# Patient Record
Sex: Female | Born: 1970 | Race: White | Hispanic: No | Smoking: Former smoker
Health system: Southern US, Community
[De-identification: ages and names within clinical notes are randomized; demographics above are authoritative.]

## PROBLEM LIST (undated history)

## (undated) DIAGNOSIS — G43009 Migraine without aura, not intractable, without status migrainosus: Secondary | ICD-10-CM

## (undated) DIAGNOSIS — F419 Anxiety disorder, unspecified: Secondary | ICD-10-CM

## (undated) DIAGNOSIS — T7840XA Allergy, unspecified, initial encounter: Secondary | ICD-10-CM

## (undated) DIAGNOSIS — F909 Attention-deficit hyperactivity disorder, unspecified type: Secondary | ICD-10-CM

## (undated) DIAGNOSIS — F331 Major depressive disorder, recurrent, moderate: Secondary | ICD-10-CM

## (undated) DIAGNOSIS — G709 Myoneural disorder, unspecified: Secondary | ICD-10-CM

## (undated) DIAGNOSIS — Z5189 Encounter for other specified aftercare: Secondary | ICD-10-CM

## (undated) HISTORY — PX: VAGINAL HYSTERECTOMY: SUR661

## (undated) HISTORY — PX: TONSILLECTOMY: SUR1361

## (undated) HISTORY — DX: Migraine without aura, not intractable, without status migrainosus: G43.009

## (undated) HISTORY — PX: TUBAL LIGATION: SHX77

## (undated) HISTORY — DX: Major depressive disorder, recurrent, moderate: F33.1

## (undated) HISTORY — PX: KNEE ARTHROSCOPY: SUR90

## (undated) HISTORY — DX: Attention-deficit hyperactivity disorder, unspecified type: F90.9

## (undated) HISTORY — PX: OTHER SURGICAL HISTORY: SHX169

## (undated) HISTORY — PX: DILATION AND CURETTAGE OF UTERUS: SHX78

## (undated) HISTORY — DX: Encounter for other specified aftercare: Z51.89

## (undated) HISTORY — DX: Allergy, unspecified, initial encounter: T78.40XA

## (undated) HISTORY — DX: Myoneural disorder, unspecified: G70.9

---

## 2012-05-01 ENCOUNTER — Telehealth: Payer: Self-pay | Admitting: *Deleted

## 2012-05-01 NOTE — Telephone Encounter (Signed)
Patient returned my call and I confirmed 05/24/12 genetics appt w/ pt.  Took paperwork to Clydie Braun.

## 2012-05-24 ENCOUNTER — Ambulatory Visit: Payer: Self-pay | Admitting: Genetic Counselor

## 2012-05-24 ENCOUNTER — Other Ambulatory Visit: Payer: Self-pay | Admitting: Lab

## 2012-05-24 NOTE — Progress Notes (Signed)
Patient did not show for her appointment.

## 2012-05-25 ENCOUNTER — Telehealth: Payer: Self-pay | Admitting: *Deleted

## 2012-05-25 NOTE — Telephone Encounter (Signed)
Left message for pt to return my call so I can reschedule genetic appt.

## 2012-06-11 ENCOUNTER — Telehealth: Payer: Self-pay | Admitting: *Deleted

## 2012-06-11 NOTE — Telephone Encounter (Signed)
Left message for pt to return my call so I can schedule a genetic appt w/ her.  Took paperwork to Clydie Braun to see if she wanted to send a letter to pt since I have been unsuccessful at reaching her.

## 2014-08-06 ENCOUNTER — Encounter: Payer: Self-pay | Admitting: Family Medicine

## 2014-08-06 LAB — HM COLONOSCOPY

## 2016-04-20 DIAGNOSIS — M545 Low back pain: Secondary | ICD-10-CM | POA: Diagnosis not present

## 2016-07-07 DIAGNOSIS — J018 Other acute sinusitis: Secondary | ICD-10-CM | POA: Diagnosis not present

## 2016-07-18 DIAGNOSIS — M79645 Pain in left finger(s): Secondary | ICD-10-CM | POA: Diagnosis not present

## 2016-10-05 DIAGNOSIS — R0789 Other chest pain: Secondary | ICD-10-CM | POA: Diagnosis not present

## 2016-10-05 DIAGNOSIS — R079 Chest pain, unspecified: Secondary | ICD-10-CM | POA: Diagnosis not present

## 2016-12-16 DIAGNOSIS — F331 Major depressive disorder, recurrent, moderate: Secondary | ICD-10-CM | POA: Diagnosis not present

## 2016-12-16 DIAGNOSIS — J018 Other acute sinusitis: Secondary | ICD-10-CM | POA: Diagnosis not present

## 2017-05-26 DIAGNOSIS — J018 Other acute sinusitis: Secondary | ICD-10-CM | POA: Diagnosis not present

## 2017-05-26 DIAGNOSIS — F5101 Primary insomnia: Secondary | ICD-10-CM | POA: Diagnosis not present

## 2017-05-26 DIAGNOSIS — F331 Major depressive disorder, recurrent, moderate: Secondary | ICD-10-CM | POA: Diagnosis not present

## 2017-06-26 DIAGNOSIS — Z6829 Body mass index (BMI) 29.0-29.9, adult: Secondary | ICD-10-CM | POA: Diagnosis not present

## 2017-06-26 DIAGNOSIS — Z0000 Encounter for general adult medical examination without abnormal findings: Secondary | ICD-10-CM | POA: Diagnosis not present

## 2017-06-30 DIAGNOSIS — Z0000 Encounter for general adult medical examination without abnormal findings: Secondary | ICD-10-CM | POA: Diagnosis not present

## 2017-07-21 DIAGNOSIS — Z1231 Encounter for screening mammogram for malignant neoplasm of breast: Secondary | ICD-10-CM | POA: Diagnosis not present

## 2017-08-25 DIAGNOSIS — J018 Other acute sinusitis: Secondary | ICD-10-CM | POA: Diagnosis not present

## 2017-10-02 DIAGNOSIS — N760 Acute vaginitis: Secondary | ICD-10-CM | POA: Diagnosis not present

## 2017-10-02 DIAGNOSIS — F331 Major depressive disorder, recurrent, moderate: Secondary | ICD-10-CM | POA: Diagnosis not present

## 2017-10-18 DIAGNOSIS — J018 Other acute sinusitis: Secondary | ICD-10-CM | POA: Diagnosis not present

## 2017-11-10 DIAGNOSIS — J329 Chronic sinusitis, unspecified: Secondary | ICD-10-CM | POA: Diagnosis not present

## 2017-12-19 DIAGNOSIS — R0981 Nasal congestion: Secondary | ICD-10-CM | POA: Diagnosis not present

## 2017-12-19 DIAGNOSIS — J343 Hypertrophy of nasal turbinates: Secondary | ICD-10-CM | POA: Diagnosis not present

## 2017-12-19 DIAGNOSIS — J3489 Other specified disorders of nose and nasal sinuses: Secondary | ICD-10-CM | POA: Diagnosis not present

## 2017-12-19 DIAGNOSIS — J342 Deviated nasal septum: Secondary | ICD-10-CM | POA: Diagnosis not present

## 2018-02-27 DIAGNOSIS — J3489 Other specified disorders of nose and nasal sinuses: Secondary | ICD-10-CM | POA: Diagnosis not present

## 2018-02-27 DIAGNOSIS — J342 Deviated nasal septum: Secondary | ICD-10-CM | POA: Diagnosis not present

## 2018-02-27 DIAGNOSIS — R0981 Nasal congestion: Secondary | ICD-10-CM | POA: Diagnosis not present

## 2018-02-27 DIAGNOSIS — J343 Hypertrophy of nasal turbinates: Secondary | ICD-10-CM | POA: Diagnosis not present

## 2018-03-14 DIAGNOSIS — R0981 Nasal congestion: Secondary | ICD-10-CM | POA: Diagnosis not present

## 2018-03-14 DIAGNOSIS — J0101 Acute recurrent maxillary sinusitis: Secondary | ICD-10-CM | POA: Diagnosis not present

## 2018-03-14 DIAGNOSIS — J322 Chronic ethmoidal sinusitis: Secondary | ICD-10-CM | POA: Diagnosis not present

## 2018-03-14 DIAGNOSIS — J320 Chronic maxillary sinusitis: Secondary | ICD-10-CM | POA: Diagnosis not present

## 2018-03-14 DIAGNOSIS — J343 Hypertrophy of nasal turbinates: Secondary | ICD-10-CM | POA: Diagnosis not present

## 2018-03-14 DIAGNOSIS — J342 Deviated nasal septum: Secondary | ICD-10-CM | POA: Diagnosis not present

## 2018-03-14 DIAGNOSIS — J019 Acute sinusitis, unspecified: Secondary | ICD-10-CM | POA: Diagnosis not present

## 2018-03-14 DIAGNOSIS — J0121 Acute recurrent ethmoidal sinusitis: Secondary | ICD-10-CM | POA: Diagnosis not present

## 2018-03-14 DIAGNOSIS — J3489 Other specified disorders of nose and nasal sinuses: Secondary | ICD-10-CM | POA: Diagnosis not present

## 2018-03-14 DIAGNOSIS — J329 Chronic sinusitis, unspecified: Secondary | ICD-10-CM | POA: Diagnosis not present

## 2018-03-23 DIAGNOSIS — J329 Chronic sinusitis, unspecified: Secondary | ICD-10-CM | POA: Diagnosis not present

## 2018-03-29 DIAGNOSIS — J329 Chronic sinusitis, unspecified: Secondary | ICD-10-CM | POA: Diagnosis not present

## 2018-04-12 DIAGNOSIS — J329 Chronic sinusitis, unspecified: Secondary | ICD-10-CM | POA: Diagnosis not present

## 2018-09-07 DIAGNOSIS — G43009 Migraine without aura, not intractable, without status migrainosus: Secondary | ICD-10-CM | POA: Diagnosis not present

## 2018-09-07 DIAGNOSIS — F900 Attention-deficit hyperactivity disorder, predominantly inattentive type: Secondary | ICD-10-CM | POA: Diagnosis not present

## 2018-10-11 DIAGNOSIS — J018 Other acute sinusitis: Secondary | ICD-10-CM | POA: Diagnosis not present

## 2018-10-11 DIAGNOSIS — G43009 Migraine without aura, not intractable, without status migrainosus: Secondary | ICD-10-CM | POA: Diagnosis not present

## 2019-04-19 DIAGNOSIS — F900 Attention-deficit hyperactivity disorder, predominantly inattentive type: Secondary | ICD-10-CM | POA: Diagnosis not present

## 2019-04-19 DIAGNOSIS — F331 Major depressive disorder, recurrent, moderate: Secondary | ICD-10-CM | POA: Diagnosis not present

## 2019-04-19 DIAGNOSIS — G43009 Migraine without aura, not intractable, without status migrainosus: Secondary | ICD-10-CM | POA: Diagnosis not present

## 2019-06-28 DIAGNOSIS — S8992XA Unspecified injury of left lower leg, initial encounter: Secondary | ICD-10-CM | POA: Diagnosis not present

## 2019-06-28 DIAGNOSIS — S93602A Unspecified sprain of left foot, initial encounter: Secondary | ICD-10-CM | POA: Diagnosis not present

## 2019-07-22 DIAGNOSIS — M25572 Pain in left ankle and joints of left foot: Secondary | ICD-10-CM | POA: Diagnosis not present

## 2019-09-02 DIAGNOSIS — F900 Attention-deficit hyperactivity disorder, predominantly inattentive type: Secondary | ICD-10-CM | POA: Diagnosis not present

## 2019-09-02 DIAGNOSIS — J018 Other acute sinusitis: Secondary | ICD-10-CM | POA: Diagnosis not present

## 2019-12-09 DIAGNOSIS — D225 Melanocytic nevi of trunk: Secondary | ICD-10-CM | POA: Diagnosis not present

## 2019-12-20 DIAGNOSIS — L821 Other seborrheic keratosis: Secondary | ICD-10-CM | POA: Diagnosis not present

## 2020-04-15 DIAGNOSIS — M9905 Segmental and somatic dysfunction of pelvic region: Secondary | ICD-10-CM | POA: Diagnosis not present

## 2020-04-15 DIAGNOSIS — M9904 Segmental and somatic dysfunction of sacral region: Secondary | ICD-10-CM | POA: Diagnosis not present

## 2020-04-15 DIAGNOSIS — M9903 Segmental and somatic dysfunction of lumbar region: Secondary | ICD-10-CM | POA: Diagnosis not present

## 2020-04-15 DIAGNOSIS — M461 Sacroiliitis, not elsewhere classified: Secondary | ICD-10-CM | POA: Diagnosis not present

## 2020-04-20 DIAGNOSIS — M9904 Segmental and somatic dysfunction of sacral region: Secondary | ICD-10-CM | POA: Diagnosis not present

## 2020-04-20 DIAGNOSIS — M9905 Segmental and somatic dysfunction of pelvic region: Secondary | ICD-10-CM | POA: Diagnosis not present

## 2020-04-20 DIAGNOSIS — M461 Sacroiliitis, not elsewhere classified: Secondary | ICD-10-CM | POA: Diagnosis not present

## 2020-04-20 DIAGNOSIS — M9903 Segmental and somatic dysfunction of lumbar region: Secondary | ICD-10-CM | POA: Diagnosis not present

## 2020-04-22 DIAGNOSIS — M461 Sacroiliitis, not elsewhere classified: Secondary | ICD-10-CM | POA: Diagnosis not present

## 2020-04-22 DIAGNOSIS — M9904 Segmental and somatic dysfunction of sacral region: Secondary | ICD-10-CM | POA: Diagnosis not present

## 2020-04-22 DIAGNOSIS — M9903 Segmental and somatic dysfunction of lumbar region: Secondary | ICD-10-CM | POA: Diagnosis not present

## 2020-04-22 DIAGNOSIS — M9905 Segmental and somatic dysfunction of pelvic region: Secondary | ICD-10-CM | POA: Diagnosis not present

## 2020-04-24 DIAGNOSIS — M461 Sacroiliitis, not elsewhere classified: Secondary | ICD-10-CM | POA: Diagnosis not present

## 2020-04-24 DIAGNOSIS — M9904 Segmental and somatic dysfunction of sacral region: Secondary | ICD-10-CM | POA: Diagnosis not present

## 2020-04-24 DIAGNOSIS — M9905 Segmental and somatic dysfunction of pelvic region: Secondary | ICD-10-CM | POA: Diagnosis not present

## 2020-04-24 DIAGNOSIS — M9903 Segmental and somatic dysfunction of lumbar region: Secondary | ICD-10-CM | POA: Diagnosis not present

## 2020-04-27 DIAGNOSIS — M9903 Segmental and somatic dysfunction of lumbar region: Secondary | ICD-10-CM | POA: Diagnosis not present

## 2020-04-27 DIAGNOSIS — M9905 Segmental and somatic dysfunction of pelvic region: Secondary | ICD-10-CM | POA: Diagnosis not present

## 2020-04-27 DIAGNOSIS — M9904 Segmental and somatic dysfunction of sacral region: Secondary | ICD-10-CM | POA: Diagnosis not present

## 2020-04-27 DIAGNOSIS — M461 Sacroiliitis, not elsewhere classified: Secondary | ICD-10-CM | POA: Diagnosis not present

## 2020-04-29 DIAGNOSIS — M461 Sacroiliitis, not elsewhere classified: Secondary | ICD-10-CM | POA: Diagnosis not present

## 2020-04-29 DIAGNOSIS — M9904 Segmental and somatic dysfunction of sacral region: Secondary | ICD-10-CM | POA: Diagnosis not present

## 2020-04-29 DIAGNOSIS — M9905 Segmental and somatic dysfunction of pelvic region: Secondary | ICD-10-CM | POA: Diagnosis not present

## 2020-04-29 DIAGNOSIS — M9903 Segmental and somatic dysfunction of lumbar region: Secondary | ICD-10-CM | POA: Diagnosis not present

## 2020-05-01 DIAGNOSIS — M9904 Segmental and somatic dysfunction of sacral region: Secondary | ICD-10-CM | POA: Diagnosis not present

## 2020-05-01 DIAGNOSIS — M9905 Segmental and somatic dysfunction of pelvic region: Secondary | ICD-10-CM | POA: Diagnosis not present

## 2020-05-01 DIAGNOSIS — M461 Sacroiliitis, not elsewhere classified: Secondary | ICD-10-CM | POA: Diagnosis not present

## 2020-05-01 DIAGNOSIS — M9903 Segmental and somatic dysfunction of lumbar region: Secondary | ICD-10-CM | POA: Diagnosis not present

## 2020-05-13 DIAGNOSIS — M9904 Segmental and somatic dysfunction of sacral region: Secondary | ICD-10-CM | POA: Diagnosis not present

## 2020-05-13 DIAGNOSIS — M461 Sacroiliitis, not elsewhere classified: Secondary | ICD-10-CM | POA: Diagnosis not present

## 2020-05-13 DIAGNOSIS — M9903 Segmental and somatic dysfunction of lumbar region: Secondary | ICD-10-CM | POA: Diagnosis not present

## 2020-05-13 DIAGNOSIS — M9905 Segmental and somatic dysfunction of pelvic region: Secondary | ICD-10-CM | POA: Diagnosis not present

## 2020-07-09 ENCOUNTER — Other Ambulatory Visit: Payer: Self-pay

## 2020-07-09 ENCOUNTER — Encounter: Payer: Self-pay | Admitting: Legal Medicine

## 2020-07-09 ENCOUNTER — Ambulatory Visit: Payer: BC Managed Care – PPO | Admitting: Legal Medicine

## 2020-07-09 DIAGNOSIS — F900 Attention-deficit hyperactivity disorder, predominantly inattentive type: Secondary | ICD-10-CM | POA: Diagnosis not present

## 2020-07-09 DIAGNOSIS — Z6836 Body mass index (BMI) 36.0-36.9, adult: Secondary | ICD-10-CM

## 2020-07-09 DIAGNOSIS — G43009 Migraine without aura, not intractable, without status migrainosus: Secondary | ICD-10-CM

## 2020-07-09 DIAGNOSIS — F331 Major depressive disorder, recurrent, moderate: Secondary | ICD-10-CM | POA: Insufficient documentation

## 2020-07-09 DIAGNOSIS — F909 Attention-deficit hyperactivity disorder, unspecified type: Secondary | ICD-10-CM | POA: Insufficient documentation

## 2020-07-09 HISTORY — DX: Body mass index (BMI) 36.0-36.9, adult: Z68.36

## 2020-07-09 MED ORDER — LISDEXAMFETAMINE DIMESYLATE 30 MG PO CAPS: 30.0000 mg | ORAL_CAPSULE | Freq: Every day | ORAL | 0 refills | Status: DC

## 2020-07-09 MED ORDER — DIVALPROEX SODIUM 250 MG PO DR TAB: 250.0000 mg | DELAYED_RELEASE_TABLET | Freq: Three times a day (TID) | ORAL | 3 refills | Status: DC

## 2020-07-09 MED ORDER — TOPIRAMATE 50 MG PO TABS: 50.0000 mg | ORAL_TABLET | Freq: Two times a day (BID) | ORAL | 6 refills | Status: DC

## 2020-07-09 NOTE — Progress Notes (Signed)
Subjective:  Patient ID: Jeanette Alvarado, female    DOB: 1971/09/10  Age: 49 y.o. MRN: [id number]  Chief Complaint  Patient presents with  . Migranes  . Anxiety  . ADHD    HPI: migraines- amovig no help, immitrex no help.  Migraines severe recurrent.occipital area.  Constant.  Under pressure- new promotion and increased staff.  Severe anxiety- 3 deaths in family.  stress at work.  ADHD stable on medicines.  On adderall off now.  She has easly distractibility at work and unable to complete job.   Current Outpatient Medications on File Prior to Visit  Medication Sig Dispense Refill  . clonazePAM (KLONOPIN PO) Take by mouth.     No current facility-administered medications on file prior to visit.   Past Medical History:  Diagnosis Date  . ADHD   . Major depressive disorder, recurrent, moderate (HCC)   . Migraine without aura, not intractable, without status migrainosus    Past Surgical History:  Procedure Laterality Date  . exploratory laparotomy multiple times for endometriosis    . KNEE ARTHROSCOPY Right   . TONSILLECTOMY    . VAGINAL HYSTERECTOMY      Family History  Problem Relation Age of Onset  . Breast cancer Mother   . Hypothyroidism Mother   . Hypertension Father   . Hyperlipidemia Father   . CVA Father    Social History   Socioeconomic History  . Marital status: Married    Spouse name: Not on file  . Number of children: Not on file  . Years of education: Not on file  . Highest education level: Not on file  Occupational History  . Not on file  Tobacco Use  . Smoking status: Current Every Day Smoker    Packs/day: 0.50  . Smokeless tobacco: Never Used  Vaping Use  . Vaping Use: Never used  Substance and Sexual Activity  . Alcohol use: Not Currently  . Drug use: Not Currently  . Sexual activity: Not on file  Other Topics Concern  . Not on file  Social History Narrative  . Not on file   Social Determinants of Health   Financial Resource  Strain:   . Difficulty of Paying Living Expenses:   Food Insecurity:   . Worried About Programme researcher, broadcasting/film/video in the Last Year:   . Barista in the Last Year:   Transportation Needs:   . Freight forwarder (Medical):   Marland Kitchen Lack of Transportation (Non-Medical):   Physical Activity:   . Days of Exercise per Week:   . Minutes of Exercise per Session:   Stress:   . Feeling of Stress :   Social Connections:   . Frequency of Communication with Friends and Family:   . Frequency of Social Gatherings with Friends and Family:   . Attends Religious Services:   . Active Member of Clubs or Organizations:   . Attends Banker Meetings:   Marland Kitchen Marital Status:     Review of Systems  Constitutional: Positive for fatigue.  HENT: Negative.   Eyes: Negative.   Respiratory: Negative.   Cardiovascular: Negative.   Gastrointestinal: Negative.   Genitourinary: Negative.   Neurological: Positive for headaches.  Psychiatric/Behavioral: Positive for agitation.     Objective:  BP (!) 140/84   Pulse 78   Temp (!) 97.4 F (36.3 C)   Resp 16   Ht 5\' 6"  (1.676 m)   Wt (!) 223 lb 3.2 oz (101.2 kg)  SpO2 96%   BMI 36.03 kg/m   BP/Weight 07/09/2020  Systolic BP 140  Diastolic BP 84  Wt. (Lbs) 223.2  BMI 36.03    Physical Exam Vitals reviewed.  Constitutional:      Appearance: Normal appearance.  HENT:     Head: Normocephalic and atraumatic.     Right Ear: Tympanic membrane normal.     Left Ear: Tympanic membrane normal.  Eyes:     Extraocular Movements: Extraocular movements intact.     Conjunctiva/sclera: Conjunctivae normal.     Pupils: Pupils are equal, round, and reactive to light.  Cardiovascular:     Rate and Rhythm: Normal rate and regular rhythm.     Pulses: Normal pulses.     Heart sounds: Normal heart sounds.  Pulmonary:     Effort: Pulmonary effort is normal.     Breath sounds: Normal breath sounds.  Musculoskeletal:        General: Normal range of  motion.     Cervical back: Normal range of motion and neck supple.  Neurological:     General: No focal deficit present.     Mental Status: She is alert and oriented to person, place, and time. Mental status is at baseline.    Depression screen [facility] 2/9 07/09/2020  Decreased Interest 2  Down, Depressed, Hopeless 1  PHQ - 2 Score 3  Altered sleeping 3  Tired, decreased energy 3  Change in appetite 3  Feeling bad or failure about yourself  1  Trouble concentrating 3  Moving slowly or fidgety/restless 0  Suicidal thoughts 0  PHQ-9 Score 16  Difficult doing work/chores Somewhat difficult      No results found for: WBC, HGB, HCT, PLT, GLUCOSE, CHOL, TRIG, HDL, LDLDIRECT, LDLCALC, ALT, AST, NA, K, CL, CREATININE, BUN, CO2, TSH, PSA, INR, GLUF, HGBA1C, MICROALBUR    Assessment & Plan:    Diagnoses and all orders for this visit: Major depressive disorder, recurrent, moderate (HCC) -     divalproex (DEPAKOTE) 250 MG DR tablet; Take 1 tablet (250 mg total) by mouth 3 (three) times daily. Patient's depression is uncontrolled with no medicines.   Anhedonia worse.  PHQ 9 was performed score 16. An individual care plan was established or reinforced today.  The patient's disease status was assessed using clinical findings on exam, labs, and or other diagnostic testing to determine patient's success in meeting treatment goals based on disease specific evidence-based guidelines and found to be worsening Recommendations include start depakote for a mood stabilizer, she probably has a mood disorder. Migraine without aura, not intractable, without status migrainosus -     topiramate (TOPAMAX) 50 MG tablet; Take 1 tablet (50 mg total) by mouth 2 (two) times daily. AN INDIVIDUAL CARE PLAN for migraine was established and reinforced today.  The patient's status was assessed using clinical findings on exam, labs, and other diagnostic testing. Patient's success at meeting treatment goals based on disease  specific evidence-bassed guidelines and found to be in poor control. RECOMMENDATIONS include starting topamax. Attention deficit hyperactivity disorder (ADHD), predominantly inattentive type -     lisdexamfetamine (VYVANSE) 30 MG capsule; Take 1 capsule (30 mg total) by mouth daily. Patient has a long history of attention problems, and no definite bipolar diagnosis.  Start vyvanse. BMI 36.0-36.9,adult An individualize plan was formulated for obesity using patient history and physical exam to encourage weight loss.  An evidence based program was formulated.  Patient is to cut portion size with meals and to plan  physical exercise 3 days a week at least 20 minutes.  Weight watchers and other programs are helpful.  Planned amount of weight loss 10 lbs.        Follow-up: Return in about 2 months (around 09/09/2020) for headaches.  An After Visit Summary was printed and given to the patient.  Brent Bulla [facility] [phone]

## 2020-07-10 MED ORDER — ATOMOXETINE HCL 25 MG PO CAPS: 25.0000 mg | ORAL_CAPSULE | Freq: Every day | ORAL | 3 refills | Status: DC

## 2020-07-10 NOTE — Addendum Note (Signed)
Addended by: Brent Bulla on: 07/10/2020 10:03 AM   Modules accepted: Orders

## 2020-07-30 ENCOUNTER — Encounter: Payer: Self-pay | Admitting: Legal Medicine

## 2020-07-30 ENCOUNTER — Ambulatory Visit: Payer: BC Managed Care – PPO | Admitting: Legal Medicine

## 2020-07-30 ENCOUNTER — Other Ambulatory Visit: Payer: Self-pay

## 2020-07-30 ENCOUNTER — Encounter: Payer: Self-pay | Admitting: Neurology

## 2020-07-30 ENCOUNTER — Other Ambulatory Visit: Payer: Self-pay | Admitting: Family Medicine

## 2020-07-30 VITALS — BP 108/70 | HR 75 | Temp 97.8°F | Resp 17 | Ht 66.0 in | Wt 214.6 lb

## 2020-07-30 DIAGNOSIS — Z6836 Body mass index (BMI) 36.0-36.9, adult: Secondary | ICD-10-CM | POA: Diagnosis not present

## 2020-07-30 DIAGNOSIS — G43009 Migraine without aura, not intractable, without status migrainosus: Secondary | ICD-10-CM | POA: Diagnosis not present

## 2020-07-30 DIAGNOSIS — F331 Major depressive disorder, recurrent, moderate: Secondary | ICD-10-CM | POA: Diagnosis not present

## 2020-07-30 MED ORDER — TOPIRAMATE 100 MG PO TABS: 100.0000 mg | ORAL_TABLET | Freq: Two times a day (BID) | ORAL | 3 refills | Status: DC

## 2020-07-30 NOTE — Progress Notes (Signed)
Subjective:  Patient ID: Jeanette Alvarado, female    DOB: 1971/05/14  Age: 49 y.o. MRN: [id number]  Chief Complaint  Patient presents with  . Headache    Patient is taking topamax    HPI: patient is having continued headaches. Despite topamax.  She drinks a lot of caffiene, stopped monster drinks, 3 coffee a day.   Current Outpatient Medications on File Prior to Visit  Medication Sig Dispense Refill  . divalproex (DEPAKOTE) 250 MG DR tablet Take 1 tablet (250 mg total) by mouth 3 (three) times daily. 90 tablet 3   No current facility-administered medications on file prior to visit.   Past Medical History:  Diagnosis Date  . ADHD   . Major depressive disorder, recurrent, moderate (HCC)   . Migraine without aura, not intractable, without status migrainosus    Past Surgical History:  Procedure Laterality Date  . exploratory laparotomy multiple times for endometriosis    . KNEE ARTHROSCOPY Right   . TONSILLECTOMY    . VAGINAL HYSTERECTOMY      Family History  Problem Relation Age of Onset  . Breast cancer Mother   . Hypothyroidism Mother   . Hypertension Father   . Hyperlipidemia Father   . CVA Father    Social History   Socioeconomic History  . Marital status: Married    Spouse name: Not on file  . Number of children: Not on file  . Years of education: Not on file  . Highest education level: Not on file  Occupational History  . Not on file  Tobacco Use  . Smoking status: Current Every Day Smoker    Packs/day: 0.50  . Smokeless tobacco: Never Used  Vaping Use  . Vaping Use: Never used  Substance and Sexual Activity  . Alcohol use: Yes    Alcohol/week: 1.0 standard drink    Types: 1 Standard drinks or equivalent per week    Comment: rarely  . Drug use: Not Currently  . Sexual activity: Not Currently  Other Topics Concern  . Not on file  Social History Narrative  . Not on file   Social Determinants of Health   Financial Resource Strain:   . Difficulty  of Paying Living Expenses: Not on file  Food Insecurity:   . Worried About Programme researcher, broadcasting/film/video in the Last Year: Not on file  . Ran Out of Food in the Last Year: Not on file  Transportation Needs:   . Lack of Transportation (Medical): Not on file  . Lack of Transportation (Non-Medical): Not on file  Physical Activity:   . Days of Exercise per Week: Not on file  . Minutes of Exercise per Session: Not on file  Stress:   . Feeling of Stress : Not on file  Social Connections:   . Frequency of Communication with Friends and Family: Not on file  . Frequency of Social Gatherings with Friends and Family: Not on file  . Attends Religious Services: Not on file  . Active Member of Clubs or Organizations: Not on file  . Attends Banker Meetings: Not on file  . Marital Status: Not on file    Review of Systems  Constitutional: Negative.   HENT: Negative.   Eyes: Negative.   Respiratory: Negative.   Cardiovascular: Negative.   Gastrointestinal: Negative.   Genitourinary: Negative.   Musculoskeletal: Negative.   Neurological: Positive for headaches.  Psychiatric/Behavioral: Positive for agitation.     Objective:  BP 108/70 (BP Location: Left  Arm, Patient Position: Sitting)   Pulse 75   Temp 97.8 F (36.6 C) (Temporal)   Resp 17   Ht 5\' 6"  (1.676 m)   Wt 214 lb 9.6 oz (97.3 kg)   SpO2 98%   BMI 34.64 kg/m   BP/Weight 07/30/2020 07/09/2020  Systolic BP 108 140  Diastolic BP 70 84  Wt. (Lbs) 214.6 223.2  BMI 34.64 36.03    Physical Exam Vitals reviewed.  Constitutional:      Appearance: She is well-developed.  HENT:     Head: Normocephalic and atraumatic.     Mouth/Throat:     Mouth: Mucous membranes are moist.     Pharynx: Oropharynx is clear.  Eyes:     Extraocular Movements: Extraocular movements intact.  Cardiovascular:     Rate and Rhythm: Normal rate and regular rhythm.     Heart sounds: Normal heart sounds.  Pulmonary:     Effort: Pulmonary effort  is normal.     Breath sounds: Normal breath sounds.  Abdominal:     Palpations: Abdomen is soft.  Musculoskeletal:        General: Normal range of motion.     Cervical back: Normal range of motion and neck supple.  Skin:    General: Skin is warm.     Capillary Refill: Capillary refill takes less than 2 seconds.  Neurological:     Mental Status: She is alert.  Psychiatric:        Mood and Affect: Mood is anxious.    Depression screen [facility] 2/9 07/30/2020 07/09/2020  Decreased Interest 2 2  Down, Depressed, Hopeless 1 1  PHQ - 2 Score 3 3  Altered sleeping 3 3  Tired, decreased energy 3 3  Change in appetite 2 3  Feeling bad or failure about yourself  0 1  Trouble concentrating 2 3  Moving slowly or fidgety/restless 0 0  Suicidal thoughts 0 0  PHQ-9 Score 13 16  Difficult doing work/chores Somewhat difficult Somewhat difficult      No results found for: WBC, HGB, HCT, PLT, GLUCOSE, CHOL, TRIG, HDL, LDLDIRECT, LDLCALC, ALT, AST, NA, K, CL, CREATININE, BUN, CO2, TSH, PSA, INR, GLUF, HGBA1C, MICROALBUR    Assessment & Plan:   1. Migraine without aura, not intractable, without status migrainosus - topiramate (TOPAMAX) 100 MG tablet; Take 1 tablet (100 mg total) by mouth 2 (two) times daily.  Dispense: 60 tablet; Refill: 3 - Ambulatory referral to Neurology Headache continue and Topamax is affecting taste, plan to refer to [place] for headaches 2. Major depressive disorder, recurrent, moderate (HCC) Patient's depression is uncontrolled with none.   Anhedonia better.  PHQ 9 was performed score 13. An individual care plan was established or reinforced today.  The patient's disease status was assessed using clinical findings on exam, labs, and or other diagnostic testing to determine patient's success in meeting treatment goals based on disease specific evidence-based guidelines and found to be worsening Recommendations include start viibryd  3. BMI 36.0-36.9,adult An individualize  plan was formulated for obesity using patient history and physical exam to encourage weight loss.  An evidence based program was formulated.  Patient is to cut portion size with meals and to plan physical exercise 3 days a week at least 20 minutes.  Weight watchers and other programs are helpful.  Planned amount of weight loss 10 lbs.    Meds ordered this encounter  Medications  . topiramate (TOPAMAX) 100 MG tablet    Sig: Take 1 tablet (  100 mg total) by mouth 2 (two) times daily.    Dispense:  60 tablet    Refill:  3    Orders Placed This Encounter  Procedures  . Ambulatory referral to Neurology     Follow-up: Return in about 2 weeks (around 08/13/2020) for headaches.  An After Visit Summary was printed and given to the patient.  Brent Bulla [facility] [phone]

## 2020-08-13 ENCOUNTER — Ambulatory Visit: Payer: BC Managed Care – PPO | Admitting: Legal Medicine

## 2020-08-13 ENCOUNTER — Other Ambulatory Visit: Payer: Self-pay

## 2020-08-13 ENCOUNTER — Encounter: Payer: Self-pay | Admitting: Legal Medicine

## 2020-08-13 VITALS — BP 112/76 | HR 80 | Temp 98.0°F | Resp 16 | Ht 66.0 in | Wt 217.0 lb

## 2020-08-13 DIAGNOSIS — F331 Major depressive disorder, recurrent, moderate: Secondary | ICD-10-CM

## 2020-08-13 DIAGNOSIS — G43009 Migraine without aura, not intractable, without status migrainosus: Secondary | ICD-10-CM | POA: Diagnosis not present

## 2020-08-13 DIAGNOSIS — F900 Attention-deficit hyperactivity disorder, predominantly inattentive type: Secondary | ICD-10-CM | POA: Diagnosis not present

## 2020-08-13 MED ORDER — VIIBRYD 20 MG PO TABS: 20.0000 mg | ORAL_TABLET | Freq: Every day | ORAL | 3 refills | Status: DC

## 2020-08-13 MED ORDER — SUMATRIPTAN SUCCINATE 100 MG PO TABS: 100.0000 mg | ORAL_TABLET | ORAL | 5 refills | Status: DC | PRN

## 2020-08-13 MED ORDER — AMPHETAMINE-DEXTROAMPHETAMINE 10 MG PO TABS: 10.0000 mg | ORAL_TABLET | Freq: Every day | ORAL | 0 refills | Status: DC

## 2020-08-13 NOTE — Progress Notes (Signed)
Subjective:  Patient ID: Jeanette Alvarado, female    DOB: February 14, 1971  Age: 49 y.o. MRN: [id number]  Chief Complaint  Patient presents with  . Headache    HPI: follow up  Headaches still has daily and is on topamax 200mg  qd. She is to see neurology. She is to go up to topamax 300mg   ADHD she is on strattera and some help but not doing well.  We will restart adderall     Current Outpatient Medications on File Prior to Visit  Medication Sig Dispense Refill  . atomoxetine (STRATTERA) 25 MG capsule Take 25 mg by mouth every morning.    . clonazePAM (KLONOPIN) 0.5 MG tablet TAKE 1 TABLET BY MOUTH EVERY DAY AS NEEDED FOR SEVERE ANXIETY 30 tablet 3  . topiramate (TOPAMAX) 100 MG tablet Take 1 tablet (100 mg total) by mouth 2 (two) times daily. 60 tablet 3  . topiramate (TOPAMAX) 50 MG tablet      No current facility-administered medications on file prior to visit.   Past Medical History:  Diagnosis Date  . ADHD   . Major depressive disorder, recurrent, moderate (HCC)   . Migraine without aura, not intractable, without status migrainosus    Past Surgical History:  Procedure Laterality Date  . exploratory laparotomy multiple times for endometriosis    . KNEE ARTHROSCOPY Right   . TONSILLECTOMY    . VAGINAL HYSTERECTOMY      Family History  Problem Relation Age of Onset  . Breast cancer Mother   . Hypothyroidism Mother   . Hypertension Father   . Hyperlipidemia Father   . CVA Father    Social History   Socioeconomic History  . Marital status: Married    Spouse name: Not on file  . Number of children: Not on file  . Years of education: Not on file  . Highest education level: Not on file  Occupational History  . Not on file  Tobacco Use  . Smoking status: Current Every Day Smoker    Packs/day: 0.50  . Smokeless tobacco: Never Used  Vaping Use  . Vaping Use: Never used  Substance and Sexual Activity  . Alcohol use: Yes    Alcohol/week: 1.0 standard drink    Types:  1 Standard drinks or equivalent per week    Comment: rarely  . Drug use: Not Currently  . Sexual activity: Not Currently  Other Topics Concern  . Not on file  Social History Narrative  . Not on file   Social Determinants of Health   Financial Resource Strain:   . Difficulty of Paying Living Expenses: Not on file  Food Insecurity:   . Worried About in the Last Year: Not on file  . Ran Out of Food in the Last Year: Not on file  Transportation Needs:   . Lack of Transportation (Medical): Not on file  . Lack of Transportation (Non-Medical): Not on file  Physical Activity:   . Days of Exercise per Week: Not on file  . Minutes of Exercise per Session: Not on file  Stress:   . Feeling of Stress : Not on file  Social Connections:   . Frequency of Communication with Friends and Family: Not on file  . Frequency of Social Gatherings with Friends and Family: Not on file  . Attends Religious Services: Not on file  . Active Member of Clubs or Organizations: Not on file  . Attends Meetings: Not on file  .  Marital Status: Not on file    Review of Systems  Constitutional: Negative.   HENT: Negative.   Eyes: Negative.   Respiratory: Positive for cough. Negative for choking, chest tightness and shortness of breath.   Cardiovascular: Negative.  Negative for chest pain, palpitations and leg swelling.  Gastrointestinal: Negative.   Endocrine: Negative.   Genitourinary: Negative.   Musculoskeletal: Negative.   Skin: Negative.   Neurological: Positive for headaches.  Psychiatric/Behavioral: Negative.      Objective:  BP 112/76   Pulse 80   Temp 98 F (36.7 C)   Resp 16   Ht 5\' 6"  (1.676 m)   Wt 217 lb (98.4 kg)   SpO2 96%   BMI 35.02 kg/m   BP/Weight 08/13/2020 07/30/2020 07/09/2020  Systolic BP 112 108 140  Diastolic BP 76 70 84  Wt. (Lbs) 217 214.6 223.2  BMI 35.02 34.64 36.03    Physical Exam Vitals reviewed.  Constitutional:       Appearance: Normal appearance.  HENT:     Head: Normocephalic and atraumatic.     Right Ear: Tympanic membrane normal.     Left Ear: Tympanic membrane normal.     Mouth/Throat:     Mouth: Mucous membranes are moist.     Pharynx: Oropharynx is clear.  Eyes:     Extraocular Movements: Extraocular movements intact.     Conjunctiva/sclera: Conjunctivae normal.     Pupils: Pupils are equal, round, and reactive to light.  Cardiovascular:     Rate and Rhythm: Normal rate and regular rhythm.     Pulses: Normal pulses.     Heart sounds: Normal heart sounds.  Pulmonary:     Effort: Pulmonary effort is normal.     Breath sounds: Normal breath sounds.  Abdominal:     General: Abdomen is flat. Bowel sounds are normal.     Palpations: Abdomen is soft.  Musculoskeletal:        General: Normal range of motion.     Cervical back: Normal range of motion and neck supple.  Skin:    General: Skin is warm.  Neurological:     General: No focal deficit present.     Mental Status: She is alert and oriented to person, place, and time.  Psychiatric:        Mood and Affect: Mood normal.        Thought Content: Thought content normal.      No results found for: WBC, HGB, HCT, PLT, GLUCOSE, CHOL, TRIG, HDL, LDLDIRECT, LDLCALC, ALT, AST, NA, K, CL, CREATININE, BUN, CO2, TSH, PSA, INR, GLUF, HGBA1C, MICROALBUR    Assessment & Plan:   1. Attention deficit hyperactivity disorder (ADHD), predominantly inattentive type - amphetamine-dextroamphetamine (ADDERALL) 10 MG tablet; Take 1 tablet (10 mg total) by mouth daily with breakfast.  Dispense: 30 tablet; Refill: 0 History of add and starting new job, try low dose adderal  2. Migraine without aura, not intractable, without status migrainosus - SUMAtriptan (IMITREX) 100 MG tablet; Take 1 tablet (100 mg total) by mouth every 2 (two) hours as needed for migraine. May repeat in 2 hours if headache persists or recurs.  Dispense: 10 tablet; Refill: 5 She is  still doing poorly of topamax, so increase to 300mg , she is to see nruologist  3. Major depressive disorder, recurrent, moderate (HCC) - Vilazodone HCl (VIIBRYD) 20 MG TABS; Take 1 tablet (20 mg total) by mouth daily.  Dispense: 90 tablet; Refill: 3  Sh eis to continue medicine and  she is doing well  Meds ordered this encounter  Medications  . amphetamine-dextroamphetamine (ADDERALL) 10 MG tablet    Sig: Take 1 tablet (10 mg total) by mouth daily with breakfast.    Dispense:  30 tablet    Refill:  0  . SUMAtriptan (IMITREX) 100 MG tablet    Sig: Take 1 tablet (100 mg total) by mouth every 2 (two) hours as needed for migraine. May repeat in 2 hours if headache persists or recurs.    Dispense:  10 tablet    Refill:  5  . Vilazodone HCl (VIIBRYD) 20 MG TABS    Sig: Take 1 tablet (20 mg total) by mouth daily.    Dispense:  90 tablet    Refill:  3    No orders of the defined types were placed in this encounter.    Follow-up: Return in about 1 month (around 09/12/2020) for headaches ADD.  An After Visit Summary was printed and given to the patient.  Brent Bulla [facility] [phone]

## 2020-08-26 NOTE — Progress Notes (Signed)
NEUROLOGY CONSULTATION NOTE  [name] MRN: [id number] DOB: 09-14-1971  Referring provider: Brent Bulla, MD Primary care provider: Brent Bulla, MD  Reason for consult:  migraines  HISTORY OF PRESENT ILLNESS: Jeanette Alvarado. Crutcher is a 49 year old right-handed female with ADHD and depression who presents for migraines.  History supplemented by referring provider's note.  She started having migraines as a teenager.  They used to be pounding posterior bilateral that would respond to Excedrin, lasting a day or two at most, 1 to 2 times a month.  She was able to function. Typical triggers include stress and allergies. She started having an intractable migraine for the past  3 months.  No preceding trigger such as head injury, illness, change in medication, or new emotional stress.  The headache is persistent but changes in location and intensity.  They are a throbbing or stabbing headache, unilateral either side, eye feels like it is going to pop out.  The severe attacks may last 3 to 4 days and occur every 1 to 2 days.  Sometimes she sees spots.  There is associated nausea, vomiting (may be due to topiramate), photophobia, phonophobia, osmophobia, eye lacrimation, but unsure if there is associated ptosis or conjunctival injection.  She cannot identify a trigger.  Ice pack and cool dark room and sleep may help.  She has been treating with Excedrin Migraine almost everyday.    Current NSAIDS:  none Current analgesics:  Excedrin Migraine Current triptans:  none Current ergotamine:  none Current anti-emetic:  none Current muscle relaxants:  none Current anti-anxiolytic:  Clonazepam 0.5mg  PRN Current sleep aide:  none Current Antihypertensive medications:  none Current Antidepressant medications:  none Current Anticonvulsant medications:  topiramate 100mg  BID (causes nausea and brain fog/word-finding difficulty) Current anti-CGRP:  none Current Vitamins/Herbal/Supplements:  none Current  Antihistamines/Decongestants:  none Other therapy:  Ice packs Hormone/birth control:  none Other medications:  Adderall  Past NSAIDS:  Ibuprofen, naproxen Past analgesics:  Fioricet, Tylenol, tramadol Past abortive triptans:  Sumatriptan 100mg  Past abortive ergotamine:  none Past muscle relaxants:  none Past anti-emetic:  Zofran and Phenergan  Past antihypertensive medications:  none Past antidepressant medications:  Effexor Past anticonvulsant medications:  Depakote Past anti-CGRP:  Aimovig (several months, injection site reaction,caused myalgias, flu-like symptoms) Past vitamins/Herbal/Supplements:  none Past antihistamines/decongestants:  none Other past therapies:  Chiropractic medication (previously helpful but not for current intractable migraine)  Caffeine:  2 to 3 cups of coffee a day, sometimes more.  No soda or energy drinks Diet:  6-7 16oz bottles of water daily.  Skips meals. Exercise:  no Depression:  Yes (due to pain); Anxiety:  Yes (due to current pain) Other pain:  no Sleep hygiene:  Poor (due to work.  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 Family history of headache:  Mom, sisters   PAST MEDICAL HISTORY: Past Medical History:  Diagnosis Date  . ADHD   . Major depressive disorder, recurrent, moderate (HCC)   . Migraine without aura, not intractable, without status migrainosus     PAST SURGICAL HISTORY: Past Surgical History:  Procedure Laterality Date  . exploratory laparotomy multiple times for endometriosis    . KNEE ARTHROSCOPY Right   . TONSILLECTOMY    . VAGINAL HYSTERECTOMY      MEDICATIONS: Current Outpatient Medications on File Prior to Visit  Medication Sig Dispense Refill  . amphetamine-dextroamphetamine (ADDERALL) 10 MG tablet Take 1 tablet (10 mg  total) by mouth daily with breakfast. 30 tablet 0  . atomoxetine (STRATTERA) 25 MG capsule Take 25 mg by mouth every morning.    .  clonazePAM (KLONOPIN) 0.5 MG tablet TAKE 1 TABLET BY MOUTH EVERY DAY AS NEEDED FOR SEVERE ANXIETY 30 tablet 3  . SUMAtriptan (IMITREX) 100 MG tablet Take 1 tablet (100 mg total) by mouth every 2 (two) hours as needed for migraine. May repeat in 2 hours if headache persists or recurs. 10 tablet 5  . topiramate (TOPAMAX) 100 MG tablet Take 1 tablet (100 mg total) by mouth 2 (two) times daily. 60 tablet 3  . topiramate (TOPAMAX) 50 MG tablet     . Vilazodone HCl (VIIBRYD) 20 MG TABS Take 1 tablet (20 mg total) by mouth daily. 90 tablet 3   No current facility-administered medications on file prior to visit.    ALLERGIES: Allergies  Allergen Reactions  . Penicillins Anaphylaxis  . Aspirin Nausea And Vomiting  . Aimovig [Erenumab-Aooe]   . Pyridium [Phenazopyridine] Rash    FAMILY HISTORY: Family History  Problem Relation Age of Onset  . Breast cancer Mother   . Hypothyroidism Mother   . Hypertension Father   . Hyperlipidemia Father   . CVA Father     SOCIAL HISTORY: Social History   Socioeconomic History  . Marital status: Married    Spouse name: Not on file  . Number of children: Not on file  . Years of education: Not on file  . Highest education level: Not on file  Occupational History  . Not on file  Tobacco Use  . Smoking status: Current Every Day Smoker    Packs/day: 0.50  . Smokeless tobacco: Never Used  Vaping Use  . Vaping Use: Never used  Substance and Sexual Activity  . Alcohol use: Yes    Alcohol/week: 1.0 standard drink    Types: 1 Standard drinks or equivalent per week    Comment: rarely  . Drug use: Not Currently  . Sexual activity: Not Currently  Other Topics Concern  . Not on file  Social History Narrative  . Not on file   Social Determinants of Health   Financial Resource Strain:   . Difficulty of Paying Living Expenses: Not on file  Food Insecurity:   . Worried About Programme researcher, broadcasting/film/video in the Last Year: Not on file  . Ran Out of Food in  the Last Year: Not on file  Transportation Needs:   . Lack of Transportation (Medical): Not on file  . Lack of Transportation (Non-Medical): Not on file  Physical Activity:   . Days of Exercise per Week: Not on file  . Minutes of Exercise per Session: Not on file  Stress:   . Feeling of Stress : Not on file  Social Connections:   . Frequency of Communication with Friends and Family: Not on file  . Frequency of Social Gatherings with Friends and Family: Not on file  . Attends Religious Services: Not on file  . Active Member of Clubs or Organizations: Not on file  . Attends Banker Meetings: Not on file  . Marital Status: Not on file  Intimate Partner Violence:   . Fear of Current or Ex-Partner: Not on file  . Emotionally Abused: Not on file  . Physically Abused: Not on file  . Sexually Abused: Not on file    PHYSICAL EXAM: Blood pressure 126/82, pulse 83, height 5\' 6"  (1.676 m), weight 218 lb 9.6 oz (99.2 kg),  SpO2 95 %. General: No acute distress.  Patient appears well-groomed.  Head:  Normocephalic/atraumatic Eyes:  fundi examined but not visualized Neck: supple, no paraspinal tenderness, full range of motion Back: No paraspinal tenderness Heart: regular rate and rhythm Lungs: Clear to auscultation bilaterally. Vascular: No carotid bruits. Neurological Exam: Mental status: alert and oriented to person, place, and time, recent and remote memory intact, fund of knowledge intact, attention and concentration intact, speech fluent and not dysarthric, language intact. Cranial nerves: CN I: not tested CN II: pupils equal, round and reactive to light, visual fields intact CN III, IV, VI:  full range of motion, no nystagmus, no ptosis CN V: facial sensation intact CN VII: upper and lower face symmetric CN VIII: hearing intact CN IX, X: gag intact, uvula midline CN XI: sternocleidomastoid and trapezius muscles intact CN XII: tongue midline Bulk & Tone: normal, no  fasciculations. Motor:  5/5 throughout  Sensation:  Pinprick and vibration sensation intact. Deep Tendon Reflexes:  2+ throughout, toes downgoing.  Finger to nose testing:  Without dysmetria.   Heel to shin:  Without dysmetria.  Gait:  Normal station and stride.  Able to turn and tandem walk. Romberg negative.  IMPRESSION: Chronic migraine without aura, with status migrainosus, intractable.  As these are new chronic daily headaches, I think imaging is warranted to rule out intracranial mass lesion, aneurysm or even extracranial vascular abnormality such as carotid dissection.  PLAN: 1.  For preventative management, will start Vyepti.  She has had adverse reaction, including injection site reaction, to prior CGRP inhibitor injection, so I don't want to use Emgality or Ajovy.  She has failed topiramate, Depakote, and Effexor.  Botox would be another option but Vyepti has the potential for immediate efficacy. 2. I will have her taper off of topiramate as it is ineffective and causing side effects. 3.  For abortive therapy, she will try Nurtec, Zofran ODT 4mg  for nausea 4.  MRI of brain with and without contrast and MRA of head and neck. 5.  She will stop Excedrin.  Limit use of pain relievers to no more than 2 days out of week to prevent risk of rebound or medication-overuse headache. 6.  Keep headache diary 7.  Exercise, hydration, caffeine cessation, sleep hygiene, monitor for and avoid triggers 8.  Consider:  magnesium citrate 400mg  daily, riboflavin 400mg  daily, and coenzyme Q10 100mg  three times daily 9.  Follow up 6 months.   Thank you for allowing me to take part in the care of this patient.  , DO  CC: , MD

## 2020-08-27 ENCOUNTER — Ambulatory Visit: Payer: BC Managed Care – PPO | Admitting: Neurology

## 2020-08-27 ENCOUNTER — Other Ambulatory Visit: Payer: Self-pay

## 2020-08-27 ENCOUNTER — Encounter: Payer: Self-pay | Admitting: Neurology

## 2020-08-27 VITALS — BP 126/82 | HR 83 | Ht 66.0 in | Wt 218.6 lb

## 2020-08-27 DIAGNOSIS — G43711 Chronic migraine without aura, intractable, with status migrainosus: Secondary | ICD-10-CM | POA: Diagnosis not present

## 2020-08-27 MED ORDER — ONDANSETRON 4 MG PO TBDP: 4.0000 mg | ORAL_TABLET | Freq: Three times a day (TID) | ORAL | 5 refills | Status: DC | PRN

## 2020-08-27 NOTE — Patient Instructions (Addendum)
  1. Start Vyepti infusion every 3 months. 2. I will have you stop topiramate:  Take 1/2 tablet twice daily for a week, then 1/2 tablet at bedtime for a week, then STOP 3. Take Nurtec at earliest onset of headache.  No more than 1 tablet in 24 hours. MRI of brain with and without contrast and MRA of head and neck. We have sent a referral to [facility] for your MRI and they will call you directly to schedule your appointment. They are located at [address] [facility]. If you need to contact them directly please call [phone]. 4.  5. Stop Excedrin.  Limit use of pain relievers to no more than 2 days out of the week.  These medications include acetaminophen, NSAIDs (ibuprofen/Advil/Motrin, naproxen/Aleve, triptans (Imitrex/sumatriptan), Excedrin, and narcotics.  This will help reduce risk of rebound headaches. 6. Be aware of common food triggers:  - Caffeine:  coffee, black tea, cola, Mt. Dew  - Chocolate  - Dairy:  aged cheeses (brie, blue, cheddar, gouda, [place], provolone, [place], Swiss, etc), chocolate milk, buttermilk, sour cream, limit eggs and yogurt  - Nuts, peanut butter  - Alcohol  - Cereals/grains:  FRESH breads (fresh bagels, sourdough, doughnuts), yeast productions  - Processed/canned/aged/cured meats (pre-packaged deli meats, hotdogs)  - MSG/glutamate:  soy sauce, flavor enhancer, pickled/preserved/marinated foods  - Sweeteners:  aspartame (Equal, Nutrasweet).  Sugar and Splenda are okay  - Vegetables:  legumes (lima beans, lentils, snow peas, fava beans, pinto peans, peas, garbanzo beans), sauerkraut, onions, olives, pickles  - Fruit:  avocados, bananas, citrus fruit (orange, lemon, grapefruit), mango  - Other:  Frozen meals, macaroni and cheese 7. Routine exercise 8. Stay adequately hydrated (aim for 64 oz water daily) 9. Keep headache diary 10. Maintain proper stress management 11. Maintain proper sleep hygiene 12. Do not skip meals 13. Consider supplements:   magnesium citrate 400mg  daily, riboflavin 400mg  daily, coenzyme Q10 100mg  three times daily.

## 2020-08-28 ENCOUNTER — Encounter: Payer: Self-pay | Admitting: Gastroenterology

## 2020-08-28 NOTE — Progress Notes (Signed)
[employer] forms for vyepti filled out and faxed over

## 2020-09-16 ENCOUNTER — Telehealth: Payer: Self-pay

## 2020-09-16 NOTE — Telephone Encounter (Signed)
Telephone call from [place] at [place], [employer] denied Vyepti. Pt do not meet criteria for approval.   Pt needs a PEER to PEER. Will fax over information.

## 2020-09-16 NOTE — Telephone Encounter (Signed)
Faxed over forms for peer to peer with clinicals.  Sent msg to [facility] and Dr. Everlena Cooper with info regarding peer to peer.

## 2020-09-17 ENCOUNTER — Ambulatory Visit
Admission: RE | Admit: 2020-09-17 | Discharge: 2020-09-17 | Disposition: A | Discharge status: Home / Self Care | Payer: BC Managed Care – PPO | Source: Ambulatory Visit | Attending: Neurology | Admitting: Neurology

## 2020-09-17 ENCOUNTER — Other Ambulatory Visit: Payer: Self-pay

## 2020-09-17 ENCOUNTER — Ambulatory Visit: Payer: BC Managed Care – PPO | Admitting: Legal Medicine

## 2020-09-17 DIAGNOSIS — G43711 Chronic migraine without aura, intractable, with status migrainosus: Secondary | ICD-10-CM

## 2020-09-17 DIAGNOSIS — R519 Headache, unspecified: Secondary | ICD-10-CM | POA: Diagnosis not present

## 2020-09-17 MED ORDER — GADOBENATE DIMEGLUMINE 529 MG/ML IV SOLN
15.0000 mL | Freq: Once | INTRAVENOUS | Status: AC | PRN
  Administered 2020-09-17: 15 mL via INTRAVENOUS

## 2020-09-17 MED ORDER — GADOBENATE DIMEGLUMINE 529 MG/ML IV SOLN: 15.0000 mL | Freq: Once | INTRAVENOUS | Status: DC | PRN

## 2020-09-18 ENCOUNTER — Telehealth: Payer: Self-pay

## 2020-09-18 NOTE — Telephone Encounter (Signed)
Pt advised of MRI Results.    Pt wanted to know what she should do until the infusion is approved? Her head hurts.

## 2020-09-18 NOTE — Telephone Encounter (Signed)
I just had the peer-to-peer. It has been approved for 6 months.  After 6 months, depending on how she is doing, we can send another prior authorization which will likely be approved if it is effective.

## 2020-09-18 NOTE — Telephone Encounter (Signed)
Pt advised of the Approval.    Telephone call, to [employer] to inform the nurse that the infusion is approved. Will  Be on the look out for the approval.

## 2020-09-22 ENCOUNTER — Telehealth: Payer: Self-pay

## 2020-09-22 NOTE — Telephone Encounter (Signed)
Not sure that we ever received approval letter for this. Was just told by Dr. Everlena Cooper that it was approved. I left a msg with the [facility] peer review department to fax [place] over the approval. As soon as I get that in I will fax to [facility] for them. Thanks!

## 2020-09-22 NOTE — Telephone Encounter (Signed)
Telephone call from [place] at [place], they never recirved the approval letter from the Dr. Rip Harbour- Peer.    [facility], can you check on this for me please. Not sure why nothings been sent.

## 2020-09-23 NOTE — Telephone Encounter (Signed)
Faxed approval letter to [facility]. Also, Copy of approval letter is now added to her Media tab.

## 2020-10-06 NOTE — Progress Notes (Signed)
First appt for Vyepti scheduled for 10/07/20

## 2020-10-07 DIAGNOSIS — G43711 Chronic migraine without aura, intractable, with status migrainosus: Secondary | ICD-10-CM | POA: Diagnosis not present

## 2020-10-21 ENCOUNTER — Ambulatory Visit: Payer: BC Managed Care – PPO | Admitting: Neurology

## 2020-12-02 DIAGNOSIS — S4352XA Sprain of left acromioclavicular joint, initial encounter: Secondary | ICD-10-CM | POA: Diagnosis not present

## 2020-12-29 DIAGNOSIS — G43711 Chronic migraine without aura, intractable, with status migrainosus: Secondary | ICD-10-CM | POA: Diagnosis not present

## 2021-01-20 ENCOUNTER — Encounter: Payer: Self-pay | Admitting: Legal Medicine

## 2021-01-20 ENCOUNTER — Other Ambulatory Visit: Payer: Self-pay

## 2021-01-20 ENCOUNTER — Ambulatory Visit: Payer: BC Managed Care – PPO | Admitting: Legal Medicine

## 2021-01-20 DIAGNOSIS — F900 Attention-deficit hyperactivity disorder, predominantly inattentive type: Secondary | ICD-10-CM | POA: Diagnosis not present

## 2021-01-20 DIAGNOSIS — G8929 Other chronic pain: Secondary | ICD-10-CM | POA: Insufficient documentation

## 2021-01-20 DIAGNOSIS — F331 Major depressive disorder, recurrent, moderate: Secondary | ICD-10-CM

## 2021-01-20 DIAGNOSIS — M25512 Pain in left shoulder: Secondary | ICD-10-CM

## 2021-01-20 HISTORY — DX: Pain in left shoulder: M25.512

## 2021-01-20 MED ORDER — AMPHETAMINE-DEXTROAMPHETAMINE 15 MG PO TABS: 15.0000 mg | ORAL_TABLET | Freq: Every day | ORAL | 0 refills | Status: DC

## 2021-01-20 NOTE — Progress Notes (Signed)
Subjective:  Patient ID: Jeanette Alvarado, female    DOB: 03-25-1971  Age: 50 y.o. MRN: [id number]  Chief Complaint  Patient presents with  . Pain    Patient states she is having shooting pains in L upper arm and posterior shoulder, started 5 days ago    HPI: shoulder pain. No pain with shoulder movement.shock from shoulder to elbow on Left.She has spasms in trapzius muscle on left .  Trigger point that mimics all pain  Patient is having worsening in her ADD at work.  She asks to increased dose.   Current Outpatient Medications on File Prior to Visit  Medication Sig Dispense Refill  . clonazePAM (KLONOPIN) 0.5 MG tablet TAKE 1 TABLET BY MOUTH EVERY DAY AS NEEDED FOR SEVERE ANXIETY 30 tablet 3  . Eptinezumab-jjmr (VYEPTI) 100 MG/ML injection Inject 100 mg into the vein every 3 (three) months.    . ondansetron (ZOFRAN ODT) 4 MG disintegrating tablet Take 1 tablet (4 mg total) by mouth every 8 (eight) hours as needed for nausea or vomiting. 20 tablet 5  . SUMAtriptan (IMITREX) 100 MG tablet Take 1 tablet (100 mg total) by mouth every 2 (two) hours as needed for migraine. May repeat in 2 hours if headache persists or recurs. 10 tablet 5   No current facility-administered medications on file prior to visit.   Past Medical History:  Diagnosis Date  . ADHD   . Major depressive disorder, recurrent, moderate (HCC)   . Migraine without aura, not intractable, without status migrainosus    Past Surgical History:  Procedure Laterality Date  . exploratory laparotomy multiple times for endometriosis    . KNEE ARTHROSCOPY Right   . TONSILLECTOMY    . VAGINAL HYSTERECTOMY      Family History  Problem Relation Age of Onset  . Breast cancer Mother   . Hypothyroidism Mother   . Hypertension Father   . Hyperlipidemia Father   . CVA Father    Social History   Socioeconomic History  . Marital status: Married    Spouse name: Not on file  . Number of children: Not on file  . Years of  education: Not on file  . Highest education level: Not on file  Occupational History  . Not on file  Tobacco Use  . Smoking status: Current Every Day Smoker    Packs/day: 0.50  . Smokeless tobacco: Never Used  Vaping Use  . Vaping Use: Never used  Substance and Sexual Activity  . Alcohol use: Yes    Alcohol/week: 1.0 standard drink    Types: 1 Standard drinks or equivalent per week    Comment: rarely  . Drug use: Not Currently  . Sexual activity: Not Currently  Other Topics Concern  . Not on file  Social History Narrative   Right handed   Lives in one story home with husband   Social Determinants of Health   Financial Resource Strain: Not on file  Food Insecurity: Not on file  Transportation Needs: Not on file  Physical Activity: Not on file  Stress: Not on file  Social Connections: Not on file    Review of Systems  Constitutional: Negative for activity change and fatigue.  HENT: Negative for congestion and sinus pain.   Eyes: Negative for visual disturbance.  Respiratory: Negative for apnea and shortness of breath.   Cardiovascular: Positive for palpitations. Negative for leg swelling.  Gastrointestinal: Negative for abdominal distention and abdominal pain.  Endocrine: Negative for polyuria.  Genitourinary:  Negative for difficulty urinating, dysuria and urgency.  Musculoskeletal: Positive for neck pain (left). Negative for arthralgias and back pain.  Neurological: Negative.   Psychiatric/Behavioral: Negative.      Objective:  BP 112/70 (BP Location: Right Arm, Patient Position: Sitting, Cuff Size: Normal)   Pulse 81   Temp 98 F (36.7 C) (Temporal)   Resp 16   Ht 5\' 6"  (1.676 m)   Wt 214 lb 12.8 oz (97.4 kg)   SpO2 98%   BMI 34.67 kg/m   BP/Weight 01/20/2021 08/27/2020 08/13/2020  Systolic BP 112 126 112  Diastolic BP 70 82 76  Wt. (Lbs) 214.8 218.6 217  BMI 34.67 35.28 35.02    Physical Exam Vitals reviewed.  Constitutional:      Appearance: Normal  appearance.  HENT:     Right Ear: Tympanic membrane normal.     Left Ear: Tympanic membrane normal.  Cardiovascular:     Rate and Rhythm: Normal rate and regular rhythm.     Pulses: Normal pulses.     Heart sounds: Normal heart sounds. No murmur heard.   Pulmonary:     Effort: No respiratory distress.     Breath sounds: Normal breath sounds. No rales.  Musculoskeletal:     Left shoulder: Tenderness and crepitus present. Normal range of motion.       Arms:     Cervical back: Normal range of motion and neck supple. No rigidity or tenderness.     Comments: Trigger point left trapezius  Neurological:     Mental Status: She is alert.    Depression screen [facility] 2/9 01/20/2021 07/30/2020 07/09/2020  Decreased Interest 1 2 2   Down, Depressed, Hopeless 1 1 1   PHQ - 2 Score 2 3 3   Altered sleeping 0 3 3  Tired, decreased energy 1 3 3   Change in appetite 0 2 3  Feeling bad or failure about yourself  0 0 1  Trouble concentrating 3 2 3   Moving slowly or fidgety/restless 2 0 0  Suicidal thoughts 0 0 0  PHQ-9 Score 8 13 16   Difficult doing work/chores Somewhat difficult Somewhat difficult Somewhat difficult      No results found for: WBC, HGB, HCT, PLT, GLUCOSE, CHOL, TRIG, HDL, LDLDIRECT, LDLCALC, ALT, AST, NA, K, CL, CREATININE, BUN, CO2, TSH, PSA, INR, GLUF, HGBA1C, MICROALBUR    Assessment & Plan:   Diagnoses and all orders for this visit: Major depressive disorder, recurrent, moderate (HCC) Attention deficit hyperactivity disorder (ADHD), predominantly inattentive type -     amphetamine-dextroamphetamine (ADDERALL) 15 MG tablet; Take 1 tablet by mouth daily with breakfast. Patient is doing well on medicines.  Increase to 15mg  qd.  Acute pain of left shoulder Patient has trigger point in mid upper trapezius muscle on left.  It reproduces all her pain.  Trigger injection.  I prepped skin and using Ethyl Chloride local anesthesia.  I injected, 2cc 1% xylocaine ad 1cc (40mg )  kenalog with good relief of pain.  No bleeding.  No comlications            Follow-up: Return in about 2 weeks (around 02/03/2021) for shoulder.  An After Visit Summary was printed and given to the patient.  , MD [facility] [phone]

## 2021-02-22 NOTE — Progress Notes (Addendum)
NEUROLOGY FOLLOW UP OFFICE NOTE  [name] [id number]  Assessment/Plan:   Migraine without aura, without status migrainosus, not intractable - improved - however, Vyepti loses efficacy midway between infusions.  1.  Migraine prevention:  ADDENDUM:  Patient had some improvement to [facility] but will try to optimize management.  Recommend Increase Vyepti to 300mg  every 3 months 2.  Migraine rescue:  She will try Nurtec again now that she has been on Vyepti for a while.  If ineffective, will likely try a nasal spray.  Zofran for nausea 3.  Limit use of pain relievers to no more than 2 days out of week to prevent risk of rebound or medication-overuse headache. 4.  Keep headache diary 5.  Follow up 6 months.  Subjective:  . Jeanette Alvarado is a 50 year old right-handed female with ADHD and depression who follows up for migraines.  UPDATE: MRI of brain with and without contrast and MRA of head and neck on 09/17/2020 personally reviewed were unremarkable.  Since last visit, started on Vyepti and tapered off of topiramate.  Vyepti works until week 6-7.  - during the first 6 weeks, she will have 1 or 2.  The last 6 weeks, she averages every other day to every 3 days.  She tried Nurtec but it was when the migraines were still frequent and severe, so they were ineffective.  Otherwise, she has been taking Excedrin.  They are lasting 14 hours.    Frequency of abortive medication: Take Excedrin every 2 to 3 days for the last 6 weeks prior to next infusion.  Current NSAIDS:  none Current analgesics:  Excedrin Migraine Current triptans:  none Current ergotamine:  none Current anti-emetic:  Zofran ODT 4mg  Current muscle relaxants:  none Current anti-anxiolytic:  Clonazepam 0.5mg  PRN Current sleep aide:  none Current Antihypertensive medications:  none Current Antidepressant medications:  none Current Anticonvulsant medications: none  Current anti-CGRP:  Vyepti 100mg , Nurtec (rescue) Current  Vitamins/Herbal/Supplements:  none Current Antihistamines/Decongestants:  none Other therapy:  Ice packs Hormone/birth control:  none Other medications:  Adderall  Caffeine:  2 to 3 cups of coffee a day, sometimes more.  No soda or energy drinks Diet:  6-7 16oz bottles of water daily.  Skips meals. Exercise:  no Depression:  Yes (due to pain); Anxiety:  Yes (due to current pain) Other pain:  no Sleep hygiene:  Poor (due to work.  She works 12 hour shifts two days on and off, she is 11/17/2020 for an [employer] company.  HISTORY: She started having migraines as a teenager.  They used to be pounding posterior bilateral that would respond to Excedrin, lasting a day or two at most, 1 to 2 times a month.  She was able to function. Typical triggers include stress and allergies. She started having an intractable migraine for the past  3 months.  No preceding trigger such as head injury, illness, change in medication, or new emotional stress.  The headache is persistent but changes in location and intensity.  They are a throbbing or stabbing headache, unilateral either side, eye feels like it is going to pop out.  The severe attacks may last 3 to 4 days and occur every 1 to 2 days.  Sometimes she sees spots.  There is associated nausea, vomiting (may be due to topiramate), photophobia, phonophobia, osmophobia, eye lacrimation, but unsure if there is associated ptosis or conjunctival injection.  She cannot identify a trigger.  Ice pack and cool dark room  and sleep may help.  She has been treating with Excedrin Migraine almost everyday.     Past NSAIDS:  Ibuprofen, naproxen Past analgesics:  Fioricet, Tylenol, tramadol Past abortive triptans:  Sumatriptan 100mg  Past abortive ergotamine:  none Past muscle relaxants:  none Past anti-emetic:  Zofran and Phenergan  Past antihypertensive medications:  none Past antidepressant medications:  Effexor Past anticonvulsant medications:  Depakote,  topiramate (causes nausea and brain fog/word-finding difficulty) Past anti-CGRP:  Aimovig (several months, injection site reaction,caused myalgias, flu-like symptoms) Past vitamins/Herbal/Supplements:  none Past antihistamines/decongestants:  none Other past therapies:  Chiropractic medication (previously helpful but not for current intractable migraine)   Family history of headache:  Mom, sisters  PAST MEDICAL HISTORY: Past Medical History:  Diagnosis Date  . ADHD   . Major depressive disorder, recurrent, moderate (HCC)   . Migraine without aura, not intractable, without status migrainosus     MEDICATIONS: Current Outpatient Medications on File Prior to Visit  Medication Sig Dispense Refill  . amphetamine-dextroamphetamine (ADDERALL) 15 MG tablet Take 1 tablet by mouth daily with breakfast. 30 tablet 0  . clonazePAM (KLONOPIN) 0.5 MG tablet TAKE 1 TABLET BY MOUTH EVERY DAY AS NEEDED FOR SEVERE ANXIETY 30 tablet 3  . Eptinezumab-jjmr (VYEPTI) 100 MG/ML injection Inject 100 mg into the vein every 3 (three) months.    . ondansetron (ZOFRAN ODT) 4 MG disintegrating tablet Take 1 tablet (4 mg total) by mouth every 8 (eight) hours as needed for nausea or vomiting. 20 tablet 5  . SUMAtriptan (IMITREX) 100 MG tablet Take 1 tablet (100 mg total) by mouth every 2 (two) hours as needed for migraine. May repeat in 2 hours if headache persists or recurs. 10 tablet 5   No current facility-administered medications on file prior to visit.    ALLERGIES: Allergies  Allergen Reactions  . Penicillins Anaphylaxis  . Aspirin Nausea And Vomiting  . Aimovig [Erenumab-Aooe]   . Pyridium [Phenazopyridine] Rash    FAMILY HISTORY: Family History  Problem Relation Age of Onset  . Breast cancer Mother   . Hypothyroidism Mother   . Hypertension Father   . Hyperlipidemia Father   . CVA Father       Objective:  Blood pressure 106/72, pulse 90, height 5\' 6"  (1.676 m), weight 216 lb 9.6 oz (98.2  kg), SpO2 98 %. General: No acute distress.  Patient appears well-groomed.    , DO  CC: , MD

## 2021-02-24 ENCOUNTER — Encounter: Payer: Self-pay | Admitting: Neurology

## 2021-02-24 ENCOUNTER — Ambulatory Visit: Payer: BC Managed Care – PPO | Admitting: Neurology

## 2021-02-24 ENCOUNTER — Other Ambulatory Visit: Payer: Self-pay

## 2021-02-24 VITALS — BP 106/72 | HR 90 | Ht 66.0 in | Wt 216.6 lb

## 2021-02-24 DIAGNOSIS — G43009 Migraine without aura, not intractable, without status migrainosus: Secondary | ICD-10-CM | POA: Diagnosis not present

## 2021-02-24 NOTE — Patient Instructions (Signed)
1.  Increase Vyepti to 300mg  every 3 months 2.  Take Nurtec 1 tablet earliest onset of migraine.  Maximum 1 tablet daily as needed.  Contact me for prescription or if we need to change to another medication 3.  Zofran for nausea 4.  Limit use of pain relievers to no more than 2 days out of week to prevent risk of rebound or medication-overuse headache. 5.  Keep headache diary 6.  Follow up 6 months.

## 2021-02-24 NOTE — Progress Notes (Signed)
Per Dr.Jaffe new referral faxed to [facility].  300 mg dosing

## 2021-03-16 IMAGING — MR MR MRA HEAD W/O CM
1 series · 21 of 48 positions shown · IV contrast (multihance)
Comparison: MRI of the brain February 16, 2015.

CLINICAL DATA: Headache, chronic, new features or increased
frequency.

EXAM:
MR HEAD WITHOUT AND WITH CONTRAST
MR CIRCLE OF WILLIS WITHOUT CONTRAST
MRA OF THE NECK WITHOUT AND WITH CONTRAST
TECHNIQUE: Multiplanar, multiecho pulse sequences of the brain and surrounding
structures were obtained without and with intravenous contrast.
Angiographic images of the head were obtained using MRA technique
without contrast. Angiographic images of the neck were obtained
using MRA technique without and with intravenous contrast.
CONTRAST:  15mL MULTIHANCE GADOBENATE DIMEGLUMINE 529 MG/ML IV SOLN

[Series 2: tof_3d_multi-slab · axial · 0.7mm · 0.35mm/px · z∈[-77,+13]mm · 21 of 136 slices shown]
[im 1/136]
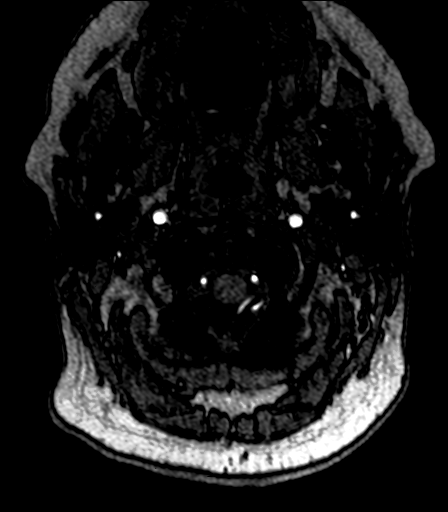
[im 3/136]
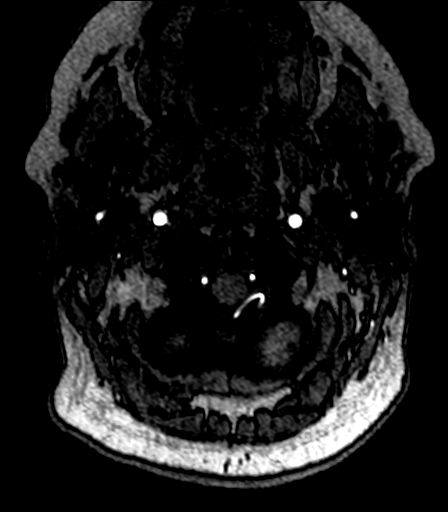
[im 6/136]
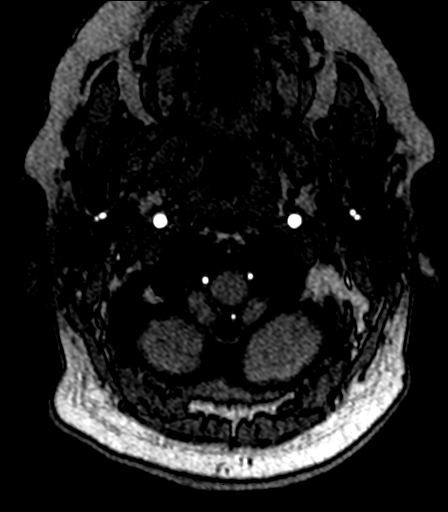
[im 9/136]
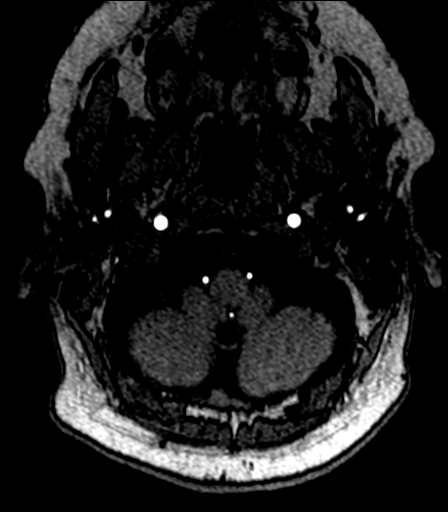
[im 12/136]
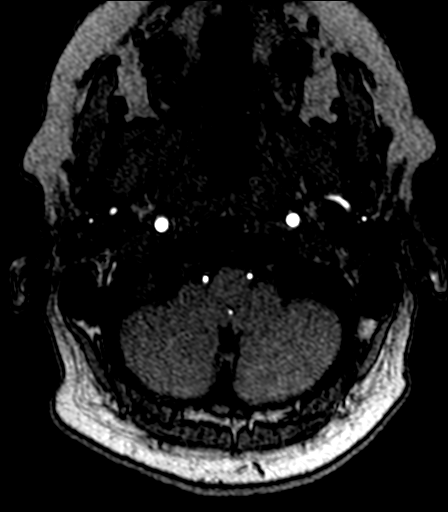
[im 15/136]
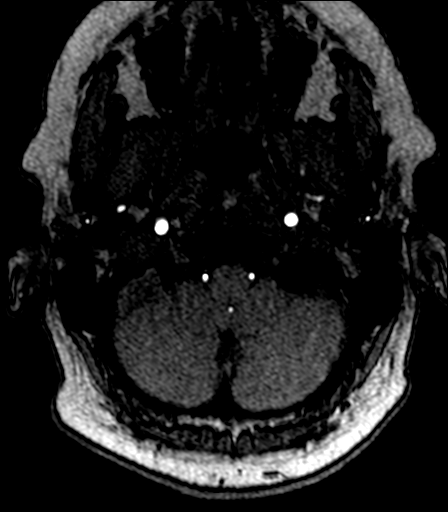
[im 18/136]
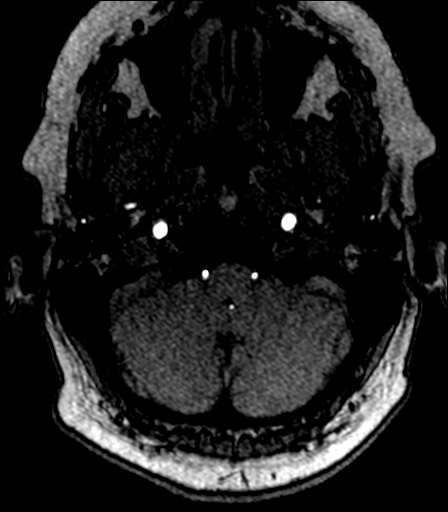
[im 21/136]
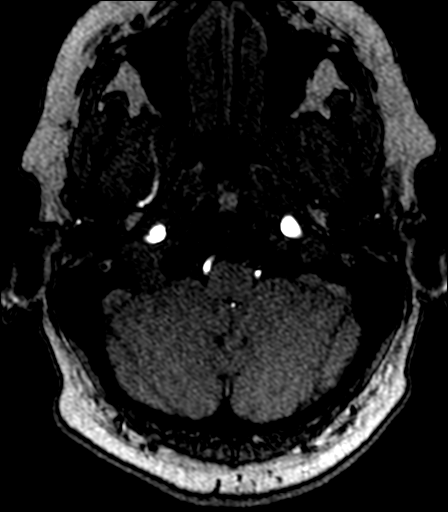
[im 23/136]
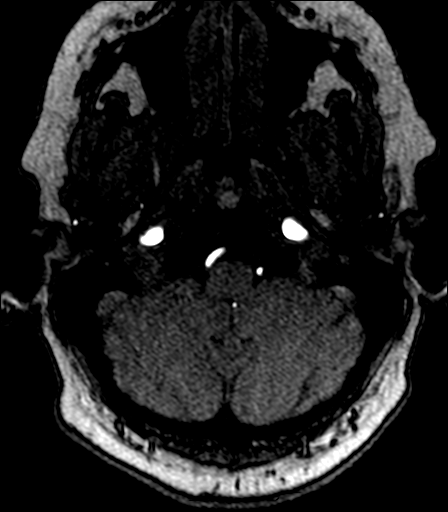
[im 26/136]
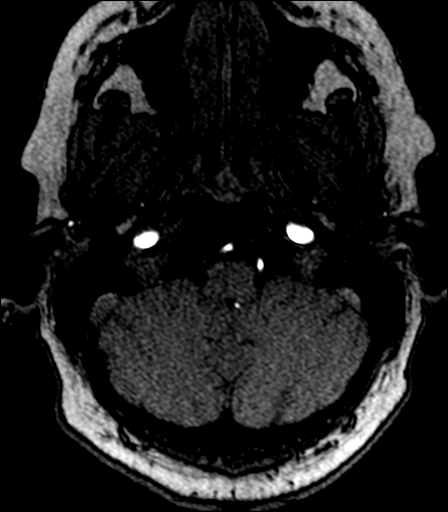
[im 29/136]
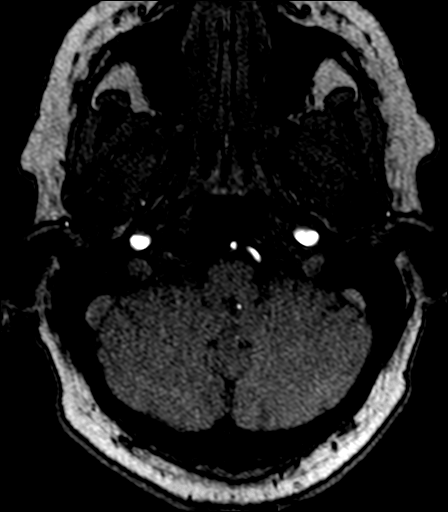
[im 32/136]
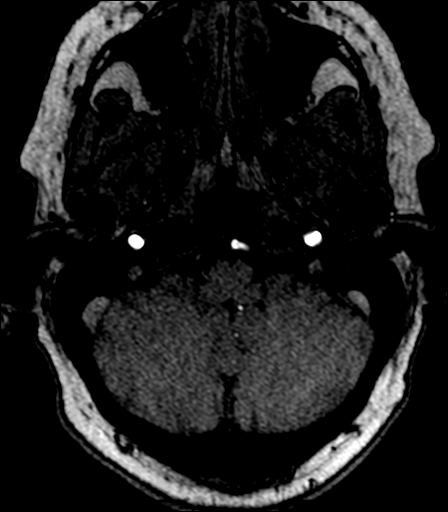
[im 35/136]
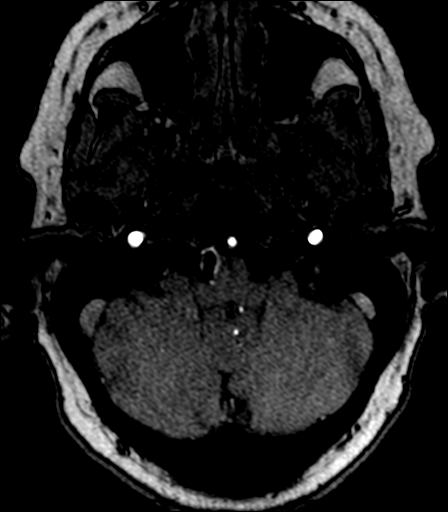
[im 44/136]
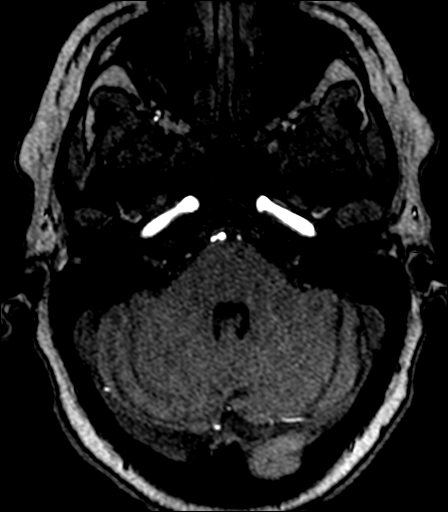
[im 61/136]
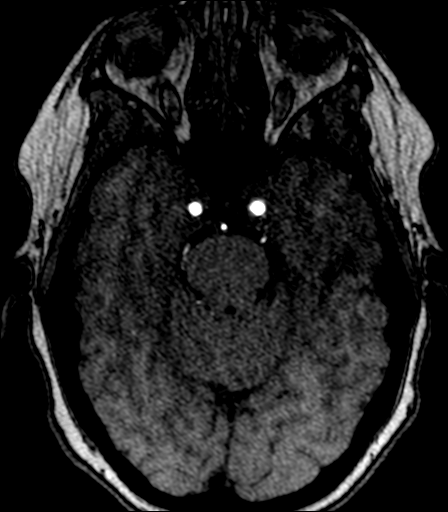
[im 69/136]
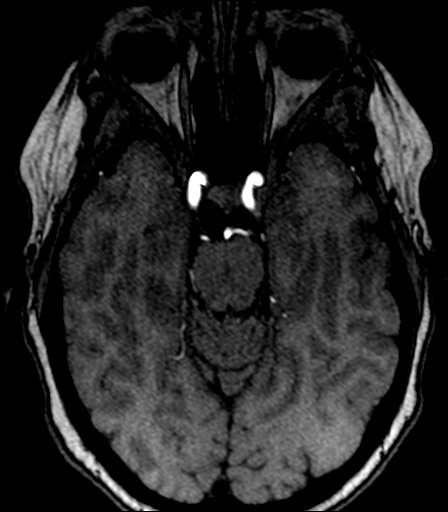
[im 78/136]
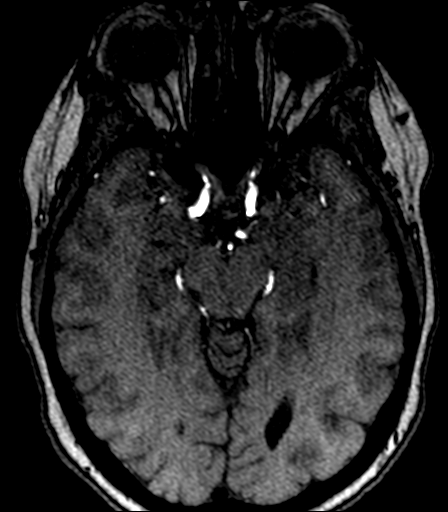
[im 95/136]
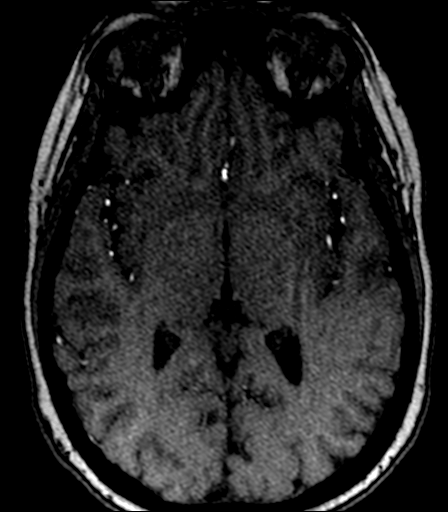
[im 113/136]
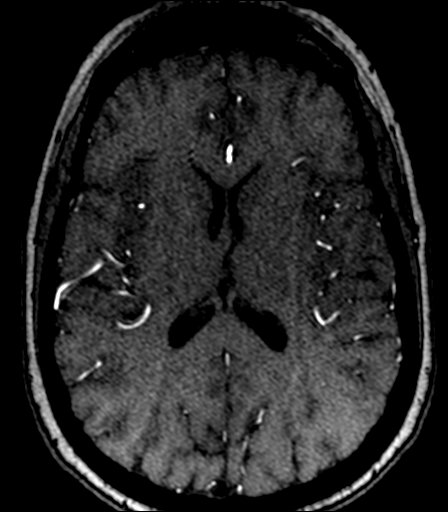
[im 115/136]
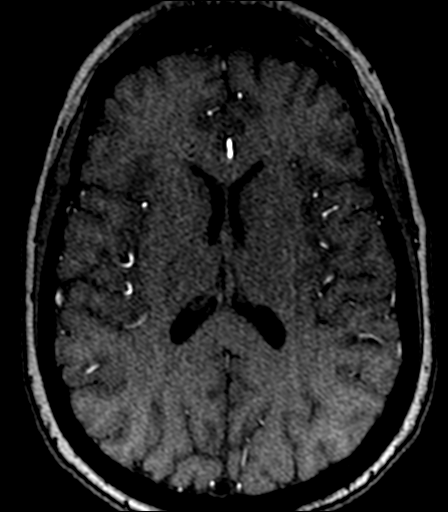
[im 130/136]
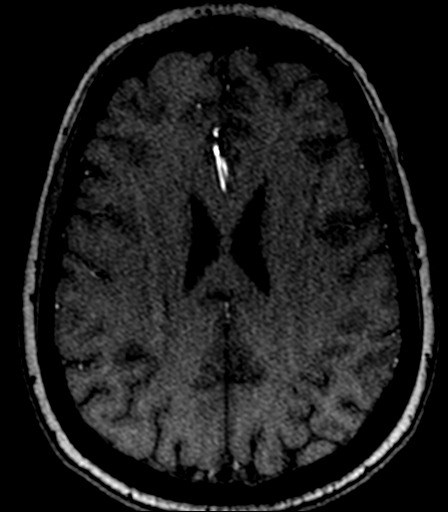

[21 of 48 positions shown; findings below may reference images not displayed]

FINDINGS: MR HEAD FINDINGS

Brain: No acute infarction, hemorrhage, hydrocephalus, extra-axial
collection or mass lesion. No focus of abnormal contrast enhancement

Vascular: Normal flow voids.

Skull and upper cervical spine: Normal marrow signal.

Sinuses/Orbits: Postsurgical changes from bilateral antrostomies and
partial ethmoidectomies. The orbits are maintained.

Other: None.

MR CIRCLE OF WILLIS FINDINGS

The visualized portions of the distal cervical and intracranial
internal carotid arteries are widely patent with normal flow related
enhancement. The bilateral anterior cerebral arteries and middle
cerebral arteries are widely patent with antegrade flow without
high-grade flow-limiting stenosis or proximal branch occlusion. The
right posterior cerebral artery originates directly from the right
ICA (fetal PCA). Prominent left posterior communicating artery.
Persistent left trigeminal artery. No intracranial aneurysm within
the anterior circulation.

The vertebral arteries are patent with antegrade flow. The basilar
artery with preserved flow without focal stenosis or aneurysm. Fetal
right PCA, prominent left posterior communicating artery and
persistent left trigeminal artery. Posterior cerebral arteries are
normal bilaterally. No intracranial aneurysm within the posterior
circulation.

MRA NECK FINDINGS

Aortic arch: Normal variant aortic arch branching pattern with the
left vertebral artery arising independently from the aortic arch.
The visualized subclavian arteries are normal.

Right carotid system: Normal course and caliber without stenosis or
evidence of dissection.

Left carotid system: Normal course and caliber without stenosis or
evidence of dissection.

Vertebral arteries: Vertebral artery origins are normal. Vertebral
arteries are normal in course and caliber to the vertebrobasilar
confluence without stenosis or evidence of dissection.
IMPRESSION: 1. Unremarkable MRI of the brain.
2. Unremarkable MRA of the head and neck. No evidence of
hemodynamically significant flow-limiting stenosis, dissection or
proximal branch occlusion.
3. Anatomical variants: Persistent left trigeminal artery, fetal
right PCA, left vertebral artery origin ating from the aortic arch.

## 2021-04-05 DIAGNOSIS — R0789 Other chest pain: Secondary | ICD-10-CM | POA: Diagnosis not present

## 2021-04-05 DIAGNOSIS — R42 Dizziness and giddiness: Secondary | ICD-10-CM | POA: Diagnosis not present

## 2021-04-05 DIAGNOSIS — I77810 Thoracic aortic ectasia: Secondary | ICD-10-CM | POA: Diagnosis not present

## 2021-04-05 DIAGNOSIS — R0602 Shortness of breath: Secondary | ICD-10-CM | POA: Diagnosis not present

## 2021-04-05 DIAGNOSIS — R112 Nausea with vomiting, unspecified: Secondary | ICD-10-CM | POA: Diagnosis not present

## 2021-04-05 DIAGNOSIS — R1084 Generalized abdominal pain: Secondary | ICD-10-CM | POA: Diagnosis not present

## 2021-04-05 DIAGNOSIS — R079 Chest pain, unspecified: Secondary | ICD-10-CM | POA: Diagnosis not present

## 2021-04-06 DIAGNOSIS — R000 Tachycardia, unspecified: Secondary | ICD-10-CM | POA: Diagnosis not present

## 2021-04-06 DIAGNOSIS — R079 Chest pain, unspecified: Secondary | ICD-10-CM | POA: Diagnosis not present

## 2021-04-06 DIAGNOSIS — I491 Atrial premature depolarization: Secondary | ICD-10-CM | POA: Diagnosis not present

## 2021-04-07 ENCOUNTER — Encounter: Payer: Self-pay | Admitting: Legal Medicine

## 2021-04-07 DIAGNOSIS — I77819 Aortic ectasia, unspecified site: Secondary | ICD-10-CM

## 2021-04-07 HISTORY — DX: Aortic ectasia, unspecified site: I77.819

## 2021-04-08 ENCOUNTER — Ambulatory Visit: Payer: BC Managed Care – PPO | Admitting: Legal Medicine

## 2021-04-08 ENCOUNTER — Other Ambulatory Visit: Payer: Self-pay

## 2021-04-08 ENCOUNTER — Encounter: Payer: Self-pay | Admitting: Legal Medicine

## 2021-04-08 VITALS — BP 102/72 | HR 79 | Temp 97.5°F | Ht 66.0 in | Wt 215.8 lb

## 2021-04-08 DIAGNOSIS — R072 Precordial pain: Secondary | ICD-10-CM | POA: Diagnosis not present

## 2021-04-08 DIAGNOSIS — K219 Gastro-esophageal reflux disease without esophagitis: Secondary | ICD-10-CM

## 2021-04-08 DIAGNOSIS — R0789 Other chest pain: Secondary | ICD-10-CM | POA: Diagnosis not present

## 2021-04-08 DIAGNOSIS — F900 Attention-deficit hyperactivity disorder, predominantly inattentive type: Secondary | ICD-10-CM

## 2021-04-08 DIAGNOSIS — I77819 Aortic ectasia, unspecified site: Secondary | ICD-10-CM

## 2021-04-08 DIAGNOSIS — J984 Other disorders of lung: Secondary | ICD-10-CM

## 2021-04-08 DIAGNOSIS — R079 Chest pain, unspecified: Secondary | ICD-10-CM

## 2021-04-08 DIAGNOSIS — K2100 Gastro-esophageal reflux disease with esophagitis, without bleeding: Secondary | ICD-10-CM

## 2021-04-08 HISTORY — DX: Chest pain, unspecified: R07.9

## 2021-04-08 HISTORY — DX: Gastro-esophageal reflux disease without esophagitis: K21.9

## 2021-04-08 MED ORDER — AMPHETAMINE-DEXTROAMPHETAMINE 15 MG PO TABS: 15.0000 mg | ORAL_TABLET | Freq: Every day | ORAL | 0 refills | Status: DC

## 2021-04-08 MED ORDER — PANTOPRAZOLE SODIUM 40 MG PO TBEC: 40.0000 mg | DELAYED_RELEASE_TABLET | Freq: Every day | ORAL | 3 refills | Status: DC

## 2021-04-08 NOTE — Progress Notes (Signed)
Subjective:  Patient ID: Jeanette Alvarado, female    DOB: 02-Mar-1971  Age: 50 y.o. MRN: [id number]  Chief Complaint  Patient presents with  . Hospitalization Follow-up    Chest Pain    HPI: [place], eKG normal, no PE., she is on pantoprazole 20mg .  Still having chest pain with constant pressure.  She is tired D-Dimer high at 620.  She was riding a motorcycle all weekend and eating poorly.   Current Outpatient Medications on File Prior to Visit  Medication Sig Dispense Refill  . clonazePAM (KLONOPIN) 0.5 MG tablet TAKE 1 TABLET BY MOUTH EVERY DAY AS NEEDED FOR SEVERE ANXIETY 30 tablet 3  . Eptinezumab-jjmr (VYEPTI) 100 MG/ML injection Inject 100 mg into the vein every 3 (three) months.    . ondansetron (ZOFRAN ODT) 4 MG disintegrating tablet Take 1 tablet (4 mg total) by mouth every 8 (eight) hours as needed for nausea or vomiting. 20 tablet 5  . SUMAtriptan (IMITREX) 100 MG tablet Take 1 tablet (100 mg total) by mouth every 2 (two) hours as needed for migraine. May repeat in 2 hours if headache persists or recurs. 10 tablet 5   No current facility-administered medications on file prior to visit.   Past Medical History:  Diagnosis Date  . ADHD   . Major depressive disorder, recurrent, moderate (HCC)   . Migraine without aura, not intractable, without status migrainosus    Past Surgical History:  Procedure Laterality Date  . exploratory laparotomy multiple times for endometriosis    . KNEE ARTHROSCOPY Right   . TONSILLECTOMY    . VAGINAL HYSTERECTOMY      Family History  Problem Relation Age of Onset  . Breast cancer Mother   . Hypothyroidism Mother   . Hypertension Father   . Hyperlipidemia Father   . CVA Father    Social History   Socioeconomic History  . Marital status: Married    Spouse name: Not on file  . Number of children: Not on file  . Years of education: Not on file  . Highest education level: Not on file   Occupational History  . Not on file  Tobacco Use  . Smoking status: Current Every Day Smoker    Packs/day: 0.50  . Smokeless tobacco: Never Used  Vaping Use  . Vaping Use: Never used  Substance and Sexual Activity  . Alcohol use: Yes    Alcohol/week: 1.0 standard drink    Types: 1 Standard drinks or equivalent per week    Comment: rarely  . Drug use: Not Currently  . Sexual activity: Not Currently  Other Topics Concern  . Not on file  Social History Narrative   Right handed   Lives in one story home with husband   Social Determinants of Health   Financial Resource Strain: Not on file  Food Insecurity: Not on file  Transportation Needs: Not on file  Physical Activity: Not on file  Stress: Not on file  Social Connections: Not on file    Review of Systems  Constitutional: Negative for activity change and appetite change.  HENT: Negative for congestion and dental problem.   Eyes: Negative for visual disturbance.  Respiratory: Negative for chest tightness and shortness of breath.   Cardiovascular: Negative for chest pain, palpitations and leg swelling.  Gastrointestinal: Negative for abdominal distention and abdominal pain.  Endocrine: Negative for polyuria.  Genitourinary: Negative for difficulty urinating, dysuria and urgency.  Musculoskeletal:  Negative for arthralgias and back pain.  Skin: Negative.   Neurological: Negative.   Psychiatric/Behavioral: Negative.      Objective:  BP 102/72 (BP Location: Right Arm, Patient Position: Sitting, Cuff Size: Normal)   Pulse 79   Temp (!) 97.5 F (36.4 C) (Temporal)   Ht 5\' 6"  (1.676 m)   Wt 215 lb 12.8 oz (97.9 kg)   SpO2 92%   BMI 34.83 kg/m   BP/Weight 04/08/2021 02/24/2021 01/20/2021  Systolic BP 102 106 112  Diastolic BP 72 72 70  Wt. 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 AN INDIVIDUAL CARE PLAN ADD was established and reinforced today.  The patient's status was assessed using clinical findings on exam, labs, and other diagnostic testing. Patient's success at meeting treatment goals based on disease specific evidence-bassed guidelines and found to be in good control. RECOMMENDATIONS include maintaining present medicines and treatment.  Other chest pain Refer to cardiology  Gastroesophageal reflux disease with esophagitis without hemorrhage -     pantoprazole (PROTONIX) 40 MG tablet; Take 1 tablet (40 mg total) by mouth daily. Increase pantoprazole to 40mg  bid for 2 to 4 weeks to calm esophagitis        Follow-up: Return in about 1 month (around 05/08/2021).  An After Visit Summary was printed and given to the patient.  , MD [facility] [phone]

## 2021-04-15 ENCOUNTER — Encounter: Payer: Self-pay | Admitting: Neurology

## 2021-04-15 NOTE — Progress Notes (Signed)
4/26- faxed appeal to ins 5/5- called ins to check status- they received appeal but could take up to 30 days for decision; ins decision due by 05/06/21.

## 2021-04-20 ENCOUNTER — Ambulatory Visit (INDEPENDENT_AMBULATORY_CARE_PROVIDER_SITE_OTHER): Payer: BC Managed Care – PPO

## 2021-04-20 ENCOUNTER — Ambulatory Visit: Payer: BC Managed Care – PPO | Admitting: Cardiology

## 2021-04-20 ENCOUNTER — Other Ambulatory Visit: Payer: Self-pay

## 2021-04-20 ENCOUNTER — Encounter: Payer: Self-pay | Admitting: Cardiology

## 2021-04-20 VITALS — BP 122/76 | Ht 66.0 in | Wt 218.4 lb

## 2021-04-20 DIAGNOSIS — I493 Ventricular premature depolarization: Secondary | ICD-10-CM | POA: Insufficient documentation

## 2021-04-20 DIAGNOSIS — Z720 Tobacco use: Secondary | ICD-10-CM | POA: Insufficient documentation

## 2021-04-20 DIAGNOSIS — Z8249 Family history of ischemic heart disease and other diseases of the circulatory system: Secondary | ICD-10-CM

## 2021-04-20 DIAGNOSIS — E669 Obesity, unspecified: Secondary | ICD-10-CM

## 2021-04-20 DIAGNOSIS — I7121 Aneurysm of the ascending aorta, without rupture: Secondary | ICD-10-CM

## 2021-04-20 DIAGNOSIS — I712 Thoracic aortic aneurysm, without rupture: Secondary | ICD-10-CM | POA: Diagnosis not present

## 2021-04-20 DIAGNOSIS — R002 Palpitations: Secondary | ICD-10-CM

## 2021-04-20 DIAGNOSIS — R079 Chest pain, unspecified: Secondary | ICD-10-CM

## 2021-04-20 HISTORY — DX: Tobacco use: Z72.0

## 2021-04-20 HISTORY — DX: Aneurysm of the ascending aorta, without rupture: I71.21

## 2021-04-20 HISTORY — DX: Family history of ischemic heart disease and other diseases of the circulatory system: Z82.49

## 2021-04-20 HISTORY — DX: Palpitations: R00.2

## 2021-04-20 HISTORY — DX: Ventricular premature depolarization: I49.3

## 2021-04-20 HISTORY — DX: Thoracic aortic aneurysm, without rupture: I71.2

## 2021-04-20 MED ORDER — METOPROLOL TARTRATE 50 MG PO TABS: ORAL_TABLET | ORAL | 0 refills | Status: DC

## 2021-04-20 NOTE — Patient Instructions (Addendum)
Medication Instructions:  Your physician recommends that you continue on your current medications as directed. Please refer to the Current Medication list given to you today.  *If you need a refill on your cardiac medications before your next appointment, please call your pharmacy*   Lab Work: Your physician recommends that you return for lab work: 3-7 DAYS BEFORE CT: BMET, Mag If you have labs (blood work) drawn today and your tests are completely normal, you will receive your results only by: Marland Kitchen MyChart Message (if you have MyChart) OR . A paper copy in the mail If you have any lab test that is abnormal or we need to change your treatment, we will call you to review the results.   Testing/Procedures: Your cardiac CT will be scheduled at:  [facility] [address] [place], [place] [address] [phone]  If scheduled at [facility], please arrive at the [facility] main entrance (entrance A) of [facility] 30 minutes prior to test start time. Proceed to the [facility] Radiology Department (first floor) to check-in and test prep.   Please follow these instructions carefully (unless otherwise directed):  On the Night Before the Test: . Be sure to Drink plenty of water. . Do not consume any caffeinated/decaffeinated beverages or chocolate 12 hours prior to your test. . Do not take any antihistamines 12 hours prior to your test.   On the Day of the Test: . Drink plenty of water until 1 hour prior to the test. . Do not eat any food 4 hours prior to the test. . You may take your regular medications prior to the test.  . Take metoprolol (Lopressor) two hours prior to test. . FEMALES- please wear underwire-free bra if available       After the Test: . Drink plenty of water. . After receiving IV contrast, you may experience a mild flushed feeling. This is normal. . On occasion, you may experience a mild rash up to 24 hours after the test. This is  not dangerous. If this occurs, you can take Benadryl 25 mg and increase your fluid intake. . If you experience trouble breathing, this can be serious. If it is severe call 911 IMMEDIATELY. If it is mild, please call our office. . If you take any of these medications: Glipizide/Metformin, Avandament, Glucavance, please do not take 48 hours after completing test unless otherwise instructed.   Once we have confirmed authorization from your insurance company, we will call you to set up a date and time for your test. Based on how quickly your insurance processes prior authorizations requests, please allow up to 4 weeks to be contacted for scheduling your Cardiac CT appointment. Be advised that routine Cardiac CT appointments could be scheduled as many as 8 weeks after your provider has ordered it.  For non-scheduling related questions, please contact the cardiac imaging nurse navigator should you have any questions/concerns: Rockwell Alexandria, Cardiac Imaging Nurse Navigator Larey Brick, Cardiac Imaging Nurse Navigator [facility] Direct Office Dial: [phone]   For scheduling needs, including cancellations and rescheduling, please call [place], [phone].     Follow-Up: At [facility], you and your health needs are our priority.  As part of our continuing mission to provide you with exceptional heart care, we have created designated Provider Care Teams.  These Care Teams include your primary Cardiologist (physician) and Advanced Practice Providers (APPs -  Physician Assistants and Nurse Practitioners) who all work together to provide  you with the care you need, when you need it.  We recommend signing up for the patient portal called "MyChart".  Sign up information is provided on this After Visit Summary.  MyChart is used to connect with patients for Virtual Visits (Telemedicine).  Patients are able to view lab/test results, encounter notes, upcoming appointments,  etc.  Non-urgent messages can be sent to your provider as well.   To learn more about what you can do with MyChart, go to [email or web address].    Your next appointment:   3 month(s)  The format for your next appointment:   In Person  Provider:   Thomasene Ripple, DO   Other Instructions ZIO  WHY IS MY DOCTOR PRESCRIBING ZIO? The Zio system is proven and trusted by physicians to detect and diagnose irregular heart rhythms -- and has been prescribed to hundreds of thousands of patients.  The FDA has cleared the Zio system to monitor for many different kinds of irregular heart rhythms. In a study, physicians were able to reach a diagnosis 90% of the time with the Zio system1.  You can wear the Zio monitor -- a small, discreet, comfortable patch -- during your normal day-to-day activity, including while you sleep, shower, and exercise, while it records every single heartbeat for analysis.  1Barrett, P., et al. Comparison of 24 Hour Holter Monitoring Versus 14 Day Novel Adhesive Patch Electrocardiographic Monitoring. American Journal of Medicine, 2014.  ZIO VS. HOLTER MONITORING The Zio monitor can be comfortably worn for up to 14 days. Holter monitors can be worn for 24 to 48 hours, limiting the time to record any irregular heart rhythms you may have. Zio is able to capture data for the 51% of patients who have their first symptom-triggered arrhythmia after 48 hours.1  LIVE WITHOUT RESTRICTIONS The Zio ambulatory cardiac monitor is a small, unobtrusive, and water-resistant patch--you might even forget you're wearing it. The Zio monitor records and stores every beat of your heart, whether you're sleeping, working out, or showering. Remove on: May 24th, 2022

## 2021-04-20 NOTE — Progress Notes (Signed)
Cardiology Office Note:    Date:  04/20/2021   ID:  Jeanette Alvarado, DOB 1971-09-03, MRN [id number]  PCP:  Abigail Miyamoto, MD  Cardiologist:  None  Electrophysiologist:  None   Referring MD: Abigail Miyamoto,*   " I have been experiencing chest pressure   History of Present Illness:    Jeanette Alvarado is a 50 y.o. female with a hx of ADHD, migraine headaches, depression, premature coronary artery disease in her father at age 37 where he had his first heart attack, current smoker, obesity is here today at the request of her primary care provider to be evaluated for chest pressure.  The patient reports that she has been experiencing intermittent chest discomfort.  She described as a pressure-like sensation which is midsternal most of the time and sometimes radiates up her chest diffusely.  She tells me today she presented to the emergency department she had her chest tightness which at that time she has some radiation to the left shoulder and the right shoulder as well.  That day she had associated nausea, vomiting, diaphoresis as well as shortness of breath.  She tells me that she was recently here today at the [facility] hospital where she was in the emergency department to get a troponin and all was normal.  She is very concerned about this because it was closer to her age when her father had his first heart attack.  In addition she has been experiencing intermittent palpitations which she describes as an abrupt onset of fast heartbeat which last for minutes at a time.  Usually she feels dizzy and lightheaded.  She has not passed out.  But he is episodes are not frequent.  Hospital she reports that when she was at the emergency department at [facility] hospital she had a CT scan of the chest there is no report of pulmonary embolism however she was told that she had a dilated aortic aneurysm and would need to repeat her CT scan in 1 year.  Past Medical History:   Diagnosis Date  . ADHD   . Major depressive disorder, recurrent, moderate (HCC)   . Migraine without aura, not intractable, without status migrainosus     Past Surgical History:  Procedure Laterality Date  . exploratory laparotomy multiple times for endometriosis    . KNEE ARTHROSCOPY Right   . TONSILLECTOMY    . VAGINAL HYSTERECTOMY      Current Medications: Current Meds  Medication Sig  . amphetamine-dextroamphetamine (ADDERALL) 15 MG tablet Take 1 tablet by mouth daily with breakfast.  . clonazePAM (KLONOPIN) 0.5 MG tablet TAKE 1 TABLET BY MOUTH EVERY DAY AS NEEDED FOR SEVERE ANXIETY  . Eptinezumab-jjmr (VYEPTI) 100 MG/ML injection Inject 100 mg into the vein every 3 (three) months.  . metoprolol tartrate (LOPRESSOR) 50 MG tablet Take 2 hours before CT scan  . ondansetron (ZOFRAN ODT) 4 MG disintegrating tablet Take 1 tablet (4 mg total) by mouth every 8 (eight) hours as needed for nausea or vomiting.  . pantoprazole (PROTONIX) 40 MG tablet Take 1 tablet (40 mg total) by mouth daily.  . SUMAtriptan (IMITREX) 100 MG tablet Take 1 tablet (100 mg total) by mouth every 2 (two) hours as needed for migraine. May repeat in 2 hours if headache persists or recurs.     Allergies:   Penicillins, Aspirin, Aimovig [erenumab-aooe], and Pyridium [phenazopyridine]   Social History   Socioeconomic History  . Marital status: Married    Spouse  name: Not on file  . Number of children: Not on file  . Years of education: Not on file  . Highest education level: Not on file  Occupational History  . Not on file  Tobacco Use  . Smoking status: Current Every Day Smoker    Packs/day: 0.50  . Smokeless tobacco: Never Used  Vaping Use  . Vaping Use: Never used  Substance and Sexual Activity  . Alcohol use: Yes    Alcohol/week: 1.0 standard drink    Types: 1 Standard drinks or equivalent per week    Comment: rarely  . Drug use: Not Currently  . Sexual activity: Not Currently  Other Topics  Concern  . Not on file  Social History Narrative   Right handed   Lives in one story home with husband   Social Determinants of Health   Financial Resource Strain: Not on file  Food Insecurity: Not on file  Transportation Needs: Not on file  Physical Activity: Not on file  Stress: Not on file  Social Connections: Not on file     Family History: The patient's family history includes Breast cancer in her mother; CVA in her father; Hyperlipidemia in her father; Hypertension in her father; Hypothyroidism in her mother.  ROS:   Review of Systems  Constitution: Negative for decreased appetite, fever and weight gain.  HENT: Negative for congestion, ear discharge, hoarse voice and sore throat.   Eyes: Negative for discharge, redness, vision loss in right eye and visual halos.  Cardiovascular: Negative for chest pain, dyspnea on exertion, leg swelling, orthopnea and palpitations.  Respiratory: Negative for cough, hemoptysis, shortness of breath and snoring.   Endocrine: Negative for heat intolerance and polyphagia.  Hematologic/Lymphatic: Negative for bleeding problem. Does not bruise/bleed easily.  Skin: Negative for flushing, nail changes, rash and suspicious lesions.  Musculoskeletal: Negative for arthritis, joint pain, muscle cramps, myalgias, neck pain and stiffness.  Gastrointestinal: Negative for abdominal pain, bowel incontinence, diarrhea and excessive appetite.  Genitourinary: Negative for decreased libido, genital sores and incomplete emptying.  Neurological: Negative for brief paralysis, focal weakness, headaches and loss of balance.  Psychiatric/Behavioral: Negative for altered mental status, depression and suicidal ideas.  Allergic/Immunologic: Negative for HIV exposure and persistent infections.    EKGs/Labs/Other Studies Reviewed:    The following studies were reviewed today:   EKG:  The ekg ordered today demonstrates sinus rhythm, heart rate 79 bpm.  CT of the  chest done on April 05, 2021 FINDINGS:  Cardiovascular: There are no filling defects within the pulmonary  arteriesto suggest pulmonary embolus. Heart is normal in size there  is mild fusiform dilatation of the ascending aorta at 4 cm. No  dissection or acute aortic findings. No pericardial effusion.   Mediastinum/Nodes: No enlarged mediastinal or hilar lymph nodes. No  esophageal wall thickening. Thyroid not visualized, may be excluded  from the field of view or diminutive.   Lungs/Pleura: Mild bronchial thickening. No acute or focal airspace  disease. No pleural effusion. No pulmonary mass or nodule.   Upper Abdomen: No acute or unexpected findings.   Musculoskeletal: There are no acute or suspicious osseous  abnormalities.   Review of the MIP images confirms the above findings.   IMPRESSION:  1. No pulmonary embolus.  2. Mild bronchial thickening, can be seen with bronchitis, reactive  airways disease, or smoking.  3. Mild fusiform dilatation of the ascending aorta at 4 cm. No  dissection or acute aortic findings. Recommend annual imaging  followup by CTA  or MRA. This recommendation follows 2010  ACCF/AHA/AATS/ACR/ASA/SCA/SCAI/SIR/STS/SVM Guidelines for the  Diagnosis and Management of Patients with Thoracic Aortic Disease.  Circulation. 2010; 121: W098-J191. Aortic aneurysm NOS (ICD10-I71.9)      Recent Labs: No results found for requested labs within last 8760 hours.  Recent Lipid Panel No results found for: CHOL, TRIG, HDL, CHOLHDL, VLDL, LDLCALC, LDLDIRECT  Physical Exam:    VS:  BP 122/76 (BP Location: Right Arm)   Ht  (1.676 m)   Wt 218 lb 6.4 oz (99.1 kg)   SpO2 97%   BMI 35.25 kg/m     Wt Readings from Last 3 Encounters:  04/20/21 218 lb 6.4 oz (99.1 kg)  04/08/21 215 lb 12.8 oz (97.9 kg)  02/24/21 216 lb 9.6 oz (98.2 kg)     GEN: Well nourished, well developed in no acute distress HEENT: Normal NECK: No JVD; No carotid bruits LYMPHATICS: No  lymphadenopathy CARDIAC: S1S2 noted,RRR, no murmurs, rubs, gallops RESPIRATORY:  Clear to auscultation without rales, wheezing or rhonchi  ABDOMEN: Soft, non-tender, non-distended, +bowel sounds, no guarding. EXTREMITIES: No edema, No cyanosis, no clubbing MUSCULOSKELETAL:  No deformity  SKIN: Warm and dry NEUROLOGIC:  Alert and oriented x 3, non-focal PSYCHIATRIC:  Normal affect, good insight  ASSESSMENT:    1. Palpitations   2. Chest pain, unspecified type   3. Tobacco use   4. Ascending aortic aneurysm (HCC)   5. Family history of premature CAD   6. Obesity (BMI 30-39.9)    PLAN:    The symptoms chest pain, premature family history and risk factors which include tobacco use and obesity is concerning, this patient does have intermediate risk for coronary artery disease and at this time I would like to pursue an ischemic evaluation in this patient.  Shared decision a coronary CTA at this time is appropriate.  I have discussed with the patient about the testing.  The patient has no IV contrast allergy and is agreeable to proceed with this test.  I would like to rule out a cardiovascular etiology of this palpitation, therefore at this time I would like to placed a zio patch for   14 days.  Smoking cessation advised.   The patient understands the need to lose weight with diet and exercise. We have discussed specific strategies for this.    The patient is in agreement with the above plan. The patient left the office in stable condition.  The patient will follow up in   Medication Adjustments/Labs and Tests Ordered: Current medicines are reviewed at length with the patient today.  Concerns regarding medicines are outlined above.  Orders Placed This Encounter  Procedures  . CT CORONARY MORPH W/CTA COR W/SCORE W/CA W/CM &/OR WO/CM  . Basic metabolic panel  . Magnesium  . LONG TERM MONITOR (3-14 DAYS)  . EKG 12-Lead   Meds ordered this encounter  Medications  . metoprolol  tartrate (LOPRESSOR) 50 MG tablet    Sig: Take 2 hours before CT scan    Dispense:  1 tablet    Refill:  0    Patient Instructions  Medication Instructions:  Your physician recommends that you continue on your current medications as directed. Please refer to the Current Medication list given to you today.  *If you need a refill on your cardiac medications before your next appointment, please call your pharmacy*   Lab Work: Your physician recommends that you return for lab work: 3-7 DAYS BEFORE CT: BMET, Mag If you have labs (  blood work) drawn today and your tests are completely normal, you will receive your results only by: Marland Kitchen MyChart Message (if you have MyChart) OR . A paper copy in the mail If you have any lab test that is abnormal or we need to change your treatment, we will call you to review the results.   Testing/Procedures: Your cardiac CT will be scheduled at:  [facility] [address] [place], [place] [address] [phone]  If scheduled at [facility], please arrive at the [facility] main entrance (entrance A) of [facility] 30 minutes prior to test start time. Proceed to the [facility] Radiology Department (first floor) to check-in and test prep.   Please follow these instructions carefully (unless otherwise directed):  On the Night Before the Test: . Be sure to Drink plenty of water. . Do not consume any caffeinated/decaffeinated beverages or chocolate 12 hours prior to your test. . Do not take any antihistamines 12 hours prior to your test.   On the Day of the Test: . Drink plenty of water until 1 hour prior to the test. . Do not eat any food 4 hours prior to the test. . You may take your regular medications prior to the test.  . Take metoprolol (Lopressor) two hours prior to test. . FEMALES- please wear underwire-free bra if available       After the Test: . Drink plenty of water. . After receiving IV contrast, you may  experience a mild flushed feeling. This is normal. . On occasion, you may experience a mild rash up to 24 hours after the test. This is not dangerous. If this occurs, you can take Benadryl 25 mg and increase your fluid intake. . If you experience trouble breathing, this can be serious. If it is severe call 911 IMMEDIATELY. If it is mild, please call our office. . If you take any of these medications: Glipizide/Metformin, Avandament, Glucavance, please do not take 48 hours after completing test unless otherwise instructed.   Once we have confirmed authorization from your insurance company, we will call you to set up a date and time for your test. Based on how quickly your insurance processes prior authorizations requests, please allow up to 4 weeks to be contacted for scheduling your Cardiac CT appointment. Be advised that routine Cardiac CT appointments could be scheduled as many as 8 weeks after your provider has ordered it.  For non-scheduling related questions, please contact the cardiac imaging nurse navigator should you have any questions/concerns: Rockwell Alexandria, Cardiac Imaging Nurse Navigator Larey Brick, Cardiac Imaging Nurse Navigator [facility] Direct Office Dial: [phone]   For scheduling needs, including cancellations and rescheduling, please call [place], [phone].     Follow-Up: At [facility], you and your health needs are our priority.  As part of our continuing mission to provide you with exceptional heart care, we have created designated Provider Care Teams.  These Care Teams include your primary Cardiologist (physician) and Advanced Practice Providers (APPs -  Physician Assistants and Nurse Practitioners) who all work together to provide you with the care you need, when you need it.  We recommend signing up for the patient portal called "MyChart".  Sign up information is provided on this After Visit Summary.  MyChart is used to  connect with patients for Virtual Visits (Telemedicine).  Patients are able to view lab/test results, encounter notes, upcoming appointments, etc.  Non-urgent messages can be sent to your provider  as well.   To learn more about what you can do with MyChart, go to [email or web address].    Your next appointment:   3 month(s)  The format for your next appointment:   In Person  Provider:   Thomasene Ripple, DO   Other Instructions ZIO  WHY IS MY DOCTOR PRESCRIBING ZIO? The Zio system is proven and trusted by physicians to detect and diagnose irregular heart rhythms -- and has been prescribed to hundreds of thousands of patients.  The FDA has cleared the Zio system to monitor for many different kinds of irregular heart rhythms. In a study, physicians were able to reach a diagnosis 90% of the time with the Zio system1.  You can wear the Zio monitor -- a small, discreet, comfortable patch -- during your normal day-to-day activity, including while you sleep, shower, and exercise, while it records every single heartbeat for analysis.  1Barrett, P., et al. Comparison of 24 Hour Holter Monitoring Versus 14 Day Novel Adhesive Patch Electrocardiographic Monitoring. American Journal of Medicine, 2014.  ZIO VS. HOLTER MONITORING The Zio monitor can be comfortably worn for up to 14 days. Holter monitors can be worn for 24 to 48 hours, limiting the time to record any irregular heart rhythms you may have. Zio is able to capture data for the 51% of patients who have their first symptom-triggered arrhythmia after 48 hours.1  LIVE WITHOUT RESTRICTIONS The Zio ambulatory cardiac monitor is a small, unobtrusive, and water-resistant patch--you might even forget you're wearing it. The Zio monitor records and stores every beat of your heart, whether you're sleeping, working out, or showering. Remove on: May 24th, 2022      Adopting a Healthy Lifestyle.  Know what a healthy weight is for you (roughly BMI  <25) and aim to maintain this   Aim for 7+ servings of fruits and vegetables daily   65-80+ fluid ounces of water or unsweet tea for healthy kidneys   Limit to max 1 drink of alcohol per day; avoid smoking/tobacco   Limit animal fats in diet for cholesterol and heart health - choose grass fed whenever available   Avoid highly processed foods, and foods high in saturated/trans fats   Aim for low stress - take time to unwind and care for your mental health   Aim for 150 min of moderate intensity exercise weekly for heart health, and weights twice weekly for bone health   Aim for 7-9 hours of sleep daily   When it comes to diets, agreement about the perfect plan isnt easy to find, even among the experts. Experts at the [facility] [employer] developed an idea known as the Healthy Eating Plate. Just imagine a plate divided into logical, healthy portions.   The emphasis is on diet quality:   Load up on vegetables and fruits - one-half of your plate: Aim for color and variety, and remember that potatoes dont count.   Go for whole grains - one-quarter of your plate: Whole wheat, barley, wheat berries, quinoa, oats, brown rice, and foods made with them. If you want pasta, go with whole wheat pasta.   Protein power - one-quarter of your plate: Fish, chicken, beans, and nuts are all healthy, versatile protein sources. Limit red meat.   The diet, however, does go beyond the plate, offering a few other suggestions.   Use healthy plant oils, such as olive, canola, soy, corn, sunflower and peanut. Check the labels, and avoid partially hydrogenated oil, which have unhealthy trans  fats.   If youre thirsty, drink water. Coffee and tea are good in moderation, but skip sugary drinks and limit milk and dairy products to one or two daily servings.   The type of carbohydrate in the diet is more important than the amount. Some sources of carbohydrates, such as vegetables, fruits, whole  grains, and beans-are healthier than others.   Finally, stay active  Signed, Thomasene RippleKardie Talayeh Bruinsma, DO  04/20/2021 4:31 PM    [facility]

## 2021-04-21 ENCOUNTER — Other Ambulatory Visit: Payer: Self-pay | Admitting: Cardiology

## 2021-05-07 ENCOUNTER — Encounter: Payer: Self-pay | Admitting: Legal Medicine

## 2021-05-07 ENCOUNTER — Ambulatory Visit (INDEPENDENT_AMBULATORY_CARE_PROVIDER_SITE_OTHER): Payer: BC Managed Care – PPO | Admitting: Legal Medicine

## 2021-05-07 ENCOUNTER — Other Ambulatory Visit: Payer: Self-pay

## 2021-05-07 VITALS — BP 110/84 | HR 89 | Temp 98.1°F | Resp 18 | Ht 66.0 in | Wt 215.0 lb

## 2021-05-07 DIAGNOSIS — F900 Attention-deficit hyperactivity disorder, predominantly inattentive type: Secondary | ICD-10-CM

## 2021-05-07 DIAGNOSIS — R0789 Other chest pain: Secondary | ICD-10-CM

## 2021-05-07 DIAGNOSIS — G43009 Migraine without aura, not intractable, without status migrainosus: Secondary | ICD-10-CM | POA: Diagnosis not present

## 2021-05-07 DIAGNOSIS — R002 Palpitations: Secondary | ICD-10-CM | POA: Diagnosis not present

## 2021-05-07 MED ORDER — CLONAZEPAM 0.5 MG PO TABS: 0.5000 mg | ORAL_TABLET | Freq: Two times a day (BID) | ORAL | 3 refills | Status: DC

## 2021-05-07 MED ORDER — AMPHETAMINE-DEXTROAMPHETAMINE 15 MG PO TABS: 15.0000 mg | ORAL_TABLET | Freq: Every day | ORAL | 0 refills | Status: DC

## 2021-05-07 MED ORDER — SUMATRIPTAN SUCCINATE 100 MG PO TABS: 100.0000 mg | ORAL_TABLET | ORAL | 5 refills | Status: AC | PRN

## 2021-05-07 NOTE — Progress Notes (Signed)
Subjective:  Patient ID: Jeanette Alvarado, female    DOB: 1971-07-27  Age: 50 y.o. MRN: [id number]  Chief Complaint  Patient presents with  . Gastroesophageal Reflux    HPI: chronic visit  Patient has gastroesophageal reflux symptoms withesophagitis and LTRD.  The symptoms are moderate to severe intensity.  Length of symptoms 3 months years.  Medicines include pantoprazole 40 mg bid.  Complications include chest pain  Chest pain.  Patient has a fusiform aortic aneurysm. No dissection   Current Outpatient Medications on File Prior to Visit  Medication Sig Dispense Refill  . metoprolol tartrate (LOPRESSOR) 50 MG tablet Take 2 hours before CT scan 1 tablet 0  . ondansetron (ZOFRAN ODT) 4 MG disintegrating tablet Take 1 tablet (4 mg total) by mouth every 8 (eight) hours as needed for nausea or vomiting. 20 tablet 5  . pantoprazole (PROTONIX) 40 MG tablet Take 1 tablet (40 mg total) by mouth daily. 30 tablet 3   No current facility-administered medications on file prior to visit.   Past Medical History:  Diagnosis Date  . ADHD   . Major depressive disorder, recurrent, moderate (HCC)   . Migraine without aura, not intractable, without status migrainosus    Past Surgical History:  Procedure Laterality Date  . exploratory laparotomy multiple times for endometriosis    . KNEE ARTHROSCOPY Right   . TONSILLECTOMY    . VAGINAL HYSTERECTOMY      Family History  Problem Relation Age of Onset  . Breast cancer Mother   . Hypothyroidism Mother   . Hypertension Father   . Hyperlipidemia Father   . CVA Father    Social History   Socioeconomic History  . Marital status: Married    Spouse name: Not on file  . Number of children: Not on file  . Years of education: Not on file  . Highest education level: Not on file  Occupational History  . Not on file  Tobacco Use  . Smoking status: Current Every Day Smoker    Packs/day: 0.50  . Smokeless tobacco: Never Used  Vaping Use  . Vaping  Use: Never used  Substance and Sexual Activity  . Alcohol use: Yes    Alcohol/week: 1.0 standard drink    Types: 1 Standard drinks or equivalent per week    Comment: rarely  . Drug use: Not Currently  . Sexual activity: Not Currently  Other Topics Concern  . Not on file  Social History Narrative   Right handed   Lives in one story home with husband   Social Determinants of Health   Financial Resource Strain: Not on file  Food Insecurity: Not on file  Transportation Needs: Not on file  Physical Activity: Not on file  Stress: Not on file  Social Connections: Not on file    Review of Systems  Constitutional: Negative for activity change and appetite change.  HENT: Negative for congestion and sinus pain.   Eyes: Negative for visual disturbance.  Respiratory: Negative for shortness of breath.   Cardiovascular: Positive for chest pain. Negative for leg swelling.  Gastrointestinal: Negative for abdominal distention and abdominal pain.  Genitourinary: Negative for difficulty urinating and dysuria.  Musculoskeletal: Negative for arthralgias and back pain.  Skin: Negative.   Neurological: Negative.   Psychiatric/Behavioral: Negative.      Objective:  BP 110/84   Pulse 89   Temp 98.1 F (36.7 C)   Resp 18   Ht 5\' 6"  (1.676 m)   Wt 215  lb (97.5 kg)   SpO2 97%   BMI 34.70 kg/m   BP/Weight 05/07/2021 04/20/2021 04/08/2021  Systolic BP 110 122 102  Diastolic BP 84 76 72  Wt. (Lbs) 215 218.4 215.8  BMI 34.7 35.25 34.83    Physical Exam Vitals reviewed.  Constitutional:      Appearance: Normal appearance. She is obese.  HENT:     Right Ear: Tympanic membrane normal.     Left Ear: Tympanic membrane normal.  Eyes:     Extraocular Movements: Extraocular movements intact.     Conjunctiva/sclera: Conjunctivae normal.     Pupils: Pupils are equal, round, and reactive to light.  Cardiovascular:     Rate and Rhythm: Normal rate and regular rhythm.     Pulses: Normal pulses.      Heart sounds: Normal heart sounds. No murmur heard. No gallop.   Pulmonary:     Effort: Pulmonary effort is normal. No respiratory distress.     Breath sounds: Normal breath sounds. No rales.  Abdominal:     General: Abdomen is flat. Bowel sounds are normal. There is no distension.     Palpations: Abdomen is soft.     Tenderness: There is no abdominal tenderness.  Musculoskeletal:        General: Normal range of motion.     Cervical back: Normal range of motion and neck supple.  Skin:    General: Skin is warm.     Capillary Refill: Capillary refill takes less than 2 seconds.  Neurological:     General: No focal deficit present.     Mental Status: She is alert and oriented to person, place, and time. Mental status is at baseline.     Depression screen [facility] 2/9 05/07/2021 05/07/2021 01/20/2021 07/30/2020 07/09/2020  Decreased Interest 1 0 1 2 2   Down, Depressed, Hopeless 0 0 1 1 1   PHQ - 2 Score 1 0 2 3 3   Altered sleeping 0 - 0 3 3  Tired, decreased energy 1 - 1 3 3   Change in appetite 0 - 0 2 3  Feeling bad or failure about yourself  0 - 0 0 1  Trouble concentrating 1 - 3 2 3   Moving slowly or fidgety/restless 0 - 2 0 0  Suicidal thoughts 0 - 0 0 0  PHQ-9 Score 3 - 8 13 16   Difficult doing work/chores Somewhat difficult - Somewhat difficult Somewhat difficult Somewhat difficult     No results found for: WBC, HGB, HCT, PLT, GLUCOSE, CHOL, TRIG, HDL, LDLDIRECT, LDLCALC, ALT, AST, NA, K, CL, CREATININE, BUN, CO2, TSH, PSA, INR, GLUF, HGBA1C, MICROALBUR    Assessment & Plan:   Diagnoses and all orders for this visit: Other chest pain -     clonazePAM (KLONOPIN) 0.5 MG tablet; Take 1 tablet (0.5 mg total) by mouth 2 (two) times daily. Patient is getting some chest pain, negative workup, restart clonazepam to hep with axiety  Attention deficit hyperactivity disorder (ADHD), predominantly inattentive type -     amphetamine-dextroamphetamine (ADDERALL) 15 MG tablet; Take 1  tablet by mouth daily with breakfast. ADD is doing well on medication, no abuse  Migraine without aura, not intractable, without status migrainosus -     SUMAtriptan (IMITREX) 100 MG tablet; Take 1 tablet (100 mg total) by mouth every 2 (two) hours as needed for migraine. May repeat in 2 hours if headache persists or recurs. Patient's migraines are improving         Follow-up: Return in about  3 months (around 08/07/2021) for fasting.  An After Visit Summary was printed and given to the patient.  Brent Bulla, MD [facility] [phone]

## 2021-05-07 NOTE — Progress Notes (Addendum)
5/27 appeal Denial received today for the 300mg  dose. Pt doesn't meet medical necessity.

## 2021-05-18 ENCOUNTER — Telehealth: Payer: Self-pay | Admitting: Cardiology

## 2021-05-18 MED ORDER — METOPROLOL TARTRATE 50 MG PO TABS: ORAL_TABLET | ORAL | 0 refills | Status: DC

## 2021-05-18 NOTE — Telephone Encounter (Signed)
  *  STAT* If patient is at the pharmacy, call can be transferred to refill team.   1. Which medications need to be refilled? (please list name of each medication and dose if known)   metoprolol tartrate (LOPRESSOR) 50 MG tablet     2. Which pharmacy/location (including street and city if local pharmacy) is medication to be sent to? [id number] - [place], [place] - [address]  3. Do they need a 30 day or 90 day supply? 1 pill  Pt lost her other pill when her son cleaned her car

## 2021-05-18 NOTE — Telephone Encounter (Signed)
Rx refill sent to pharmacy. 

## 2021-05-19 ENCOUNTER — Telehealth: Payer: Self-pay

## 2021-05-19 ENCOUNTER — Other Ambulatory Visit: Payer: Self-pay | Admitting: Cardiology

## 2021-05-19 LAB — BASIC METABOLIC PANEL
BUN/Creatinine Ratio: 18 (ref 9–23)
BUN: 12 mg/dL (ref 6–24)
CO2: 17 mmol/L — ABNORMAL LOW (ref 20–29)
Calcium: 9.2 mg/dL (ref 8.7–10.2)
Chloride: 106 mmol/L (ref 96–106)
Creatinine, Ser: 0.65 mg/dL (ref 0.57–1.00)
Glucose: 118 mg/dL — ABNORMAL HIGH (ref 65–99)
Potassium: 4.3 mmol/L (ref 3.5–5.2)
Sodium: 138 mmol/L (ref 134–144)
eGFR: 109 mL/min/{1.73_m2} (ref >59)

## 2021-05-19 LAB — MAGNESIUM: Magnesium: 1.9 mg/dL (ref 1.6–2.3)

## 2021-05-19 NOTE — Telephone Encounter (Signed)
-----   Message from Thomasene Ripple, DO sent at 05/13/2021  8:35 AM EDT ----- Your monitor did not show some evidence that your heart rate goes up to the 200s with paroxysmal SVT, I like to

## 2021-05-19 NOTE — Telephone Encounter (Signed)
Refused this because this was just a one time dose for a CT Scan.

## 2021-05-19 NOTE — Telephone Encounter (Signed)
-----   Message from Baldo Daub, MD sent at 05/19/2021  7:48 AM EDT ----- Good results, stable

## 2021-05-19 NOTE — Telephone Encounter (Signed)
Patient is returning call.  °

## 2021-05-19 NOTE — Telephone Encounter (Signed)
Results reviewed with pt as per Dr. Munley's note.  Pt verbalized understanding and had no additional questions. Routed to PCP  

## 2021-05-19 NOTE — Telephone Encounter (Signed)
Left message on patients voicemail to please return our call.   

## 2021-05-19 NOTE — Telephone Encounter (Signed)
Spoke with patient regarding results and recommendation.  Patient verbalizes understanding and is agreeable to plan of care. Advised patient to call back with any issues or concerns.  

## 2021-05-21 ENCOUNTER — Telehealth (HOSPITAL_COMMUNITY): Payer: Self-pay | Admitting: Emergency Medicine

## 2021-05-21 NOTE — Telephone Encounter (Signed)
Attempted to call patient regarding upcoming cardiac CT appointment. °Left message on voicemail with name and callback number °Cleota Pellerito RN Navigator Cardiac Imaging °[facility] °[phone] Office °[phone] Cell ° °

## 2021-05-21 NOTE — Telephone Encounter (Signed)
Reaching out to patient to offer assistance regarding upcoming cardiac imaging study; pt verbalizes understanding of appt date/time, parking situation and where to check in, pre-test NPO status and medications ordered, and verified current allergies; name and call back number provided for further questions should they arise Rockwell Alexandria RN Navigator Cardiac Imaging [facility] [phone] office [phone] cell   50mg  metoprolol tart, holding adderall  Katheryn Culliton

## 2021-05-24 ENCOUNTER — Ambulatory Visit (HOSPITAL_COMMUNITY)
Admission: RE | Admit: 2021-05-24 | Discharge: 2021-05-24 | Disposition: A | Discharge status: Home / Self Care | Payer: BC Managed Care – PPO | Source: Ambulatory Visit | Attending: Cardiology | Admitting: Cardiology

## 2021-05-24 ENCOUNTER — Other Ambulatory Visit: Payer: Self-pay

## 2021-05-24 ENCOUNTER — Telehealth (HOSPITAL_COMMUNITY): Payer: Self-pay | Admitting: Emergency Medicine

## 2021-05-24 ENCOUNTER — Encounter (HOSPITAL_COMMUNITY): Payer: Self-pay

## 2021-05-24 DIAGNOSIS — R079 Chest pain, unspecified: Secondary | ICD-10-CM

## 2021-05-24 MED ORDER — NITROGLYCERIN 0.4 MG SL SUBL: 0.8000 mg | SUBLINGUAL_TABLET | Freq: Once | SUBLINGUAL | Status: DC

## 2021-05-24 NOTE — Telephone Encounter (Signed)
Left detailed message about how patient could contact [facility] to request refund of radiology copay since her CCTA was cancelled.  [phone]  Rockwell Alexandria RN Navigator Cardiac Imaging [facility] [phone] Office  [phone] Cell

## 2021-05-31 ENCOUNTER — Telehealth: Payer: Self-pay | Admitting: Neurology

## 2021-05-31 NOTE — Telephone Encounter (Signed)
Received Appeal Denial letter for [facility] and spoke with [facility]  @ [employer]  [phone] 05/31/2021   At this appeal stage provider cannot do a peer to peer  Once Aimovig, Emgality, and Ajovy have been tried and there is documentation they all failed, We can then call Ernestene Mention @ [phone] (she  is assigned to this appeal case)   She will then ok the second appeal to proceed.

## 2021-06-01 ENCOUNTER — Other Ambulatory Visit: Payer: Self-pay

## 2021-06-01 ENCOUNTER — Encounter: Payer: Self-pay | Admitting: Cardiology

## 2021-06-01 ENCOUNTER — Ambulatory Visit: Payer: BC Managed Care – PPO | Admitting: Cardiology

## 2021-06-01 VITALS — BP 110/80 | HR 75 | Ht 66.0 in | Wt 214.4 lb

## 2021-06-01 DIAGNOSIS — I472 Ventricular tachycardia: Secondary | ICD-10-CM | POA: Diagnosis not present

## 2021-06-01 DIAGNOSIS — R072 Precordial pain: Secondary | ICD-10-CM

## 2021-06-01 DIAGNOSIS — I4719 Other supraventricular tachycardia: Secondary | ICD-10-CM | POA: Insufficient documentation

## 2021-06-01 DIAGNOSIS — I471 Supraventricular tachycardia: Secondary | ICD-10-CM

## 2021-06-01 DIAGNOSIS — R5383 Other fatigue: Secondary | ICD-10-CM

## 2021-06-01 DIAGNOSIS — R0602 Shortness of breath: Secondary | ICD-10-CM

## 2021-06-01 DIAGNOSIS — I4729 Other ventricular tachycardia: Secondary | ICD-10-CM

## 2021-06-01 HISTORY — DX: Other ventricular tachycardia: I47.29

## 2021-06-01 HISTORY — DX: Other supraventricular tachycardia: I47.19

## 2021-06-01 HISTORY — DX: Shortness of breath: R06.02

## 2021-06-01 HISTORY — DX: Supraventricular tachycardia: I47.1

## 2021-06-01 HISTORY — DX: Other fatigue: R53.83

## 2021-06-01 HISTORY — DX: Ventricular tachycardia: I47.2

## 2021-06-01 MED ORDER — METOPROLOL SUCCINATE ER 25 MG PO TB24: 12.5000 mg | ORAL_TABLET | Freq: Every day | ORAL | 3 refills | Status: DC

## 2021-06-01 NOTE — Patient Instructions (Signed)
Medication Instructions:  Your physician has recommended you make the following change in your medication: START: TOPROL XL 12.5 mg take 1/2 tablet by mouth daily.  *If you need a refill on your cardiac medications before your next appointment, please call your pharmacy*   Lab Work: Your physician recommends that you return for lab work in: TODAY Vitamin D If you have labs (blood work) drawn today and your tests are completely normal, you will receive your results only by: MyChart Message (if you have MyChart) OR A paper copy in the mail If you have any lab test that is abnormal or we need to change your treatment, we will call you to review the results.   Testing/Procedures: Your physician has requested that you have an echocardiogram. Echocardiography is a painless test that uses sound waves to create images of your heart. It provides your doctor with information about the size and shape of your heart and how well your heart's chambers and valves are working. This procedure takes approximately one hour. There are no restrictions for this procedure.    [facility] Nuclear Imaging [address] [place], [place] [address] Phone:  [phone]    Please arrive 15 minutes prior to your appointment time for registration and insurance purposes.  The test will take approximately 3 to 4 hours to complete; you may bring reading material.  If someone comes with you to your appointment, they will need to remain in the main lobby due to limited space in the testing area. **If you are pregnant or breastfeeding, please notify the nuclear lab prior to your appointment**  How to prepare for your Myocardial Perfusion Test: Do not eat or drink 3 hours prior to your test, except you may have water. Do not consume products containing caffeine (regular or decaffeinated) 12 hours prior to your test. (ex: coffee, chocolate, sodas, tea). Do bring a list of your current medications with you.  If  not listed below, you may take your medications as normal. Do wear comfortable clothes (no dresses or overalls) and walking shoes, tennis shoes preferred (No heels or open toe shoes are allowed). Do NOT wear cologne, perfume, aftershave, or lotions (deodorant is allowed). If these instructions are not followed, your test will have to be rescheduled.  Please report to [address] for your test.  If you have questions or concerns about your appointment, you can call the [facility] Lab at [phone].  If you cannot keep your appointment, please provide 24 hours notification to the Nuclear Lab, to avoid a possible $50 charge to your account.    Follow-Up: At [facility], you and your health needs are our priority.  As part of our continuing mission to provide you with exceptional heart care, we have created designated Provider Care Teams.  These Care Teams include your primary Cardiologist (physician) and Advanced Practice Providers (APPs -  Physician Assistants and Nurse Practitioners) who all work together to provide you with the care you need, when you need it.  We recommend signing up for the patient portal called "MyChart".  Sign up information is provided on this After Visit Summary.  MyChart is used to connect with patients for Virtual Visits (Telemedicine).  Patients are able to view lab/test results, encounter notes, upcoming appointments, etc.  Non-urgent messages can be sent to your provider as well.   To learn more about what you can do with MyChart, go to [email or web address].    Your next appointment:  12 week(s)  The format for your next appointment:   In Person  Provider:   Thomasene Ripple, DO   Other Instructions

## 2021-06-01 NOTE — Progress Notes (Signed)
Cardiology Office Note:    Date:  06/01/2021   ID:  Jeanette Alvarado, DOB 02-24-1971, MRN [id number]  PCP:  Abigail Miyamoto, MD  Cardiologist:  Thomasene Ripple, DO  Electrophysiologist:  None   Referring MD: Abigail Miyamoto   No chief complaint on file. ' I am still short of breath and fatigue"  History of Present Illness:    Jeanette Alvarado is a 50 y.o. female with a hx of obesity, depression, premature family history of coronary artery disease initially presented to be evaluated for intermittent chest pain as well as palpitations.  I placed a monitor on the patient as well as got a coronary CTA.  The patient was unable to get her coronary CTA due to uncontrolled heart rate.  She did wear her monitor which she is here to discuss the result.  She tells me that the fatigue is getting worse shortness of breath also is present still.  Past Medical History:  Diagnosis Date   ADHD    Major depressive disorder, recurrent, moderate (HCC)    Migraine without aura, not intractable, without status migrainosus     Past Surgical History:  Procedure Laterality Date   exploratory laparotomy multiple times for endometriosis     KNEE ARTHROSCOPY Right    TONSILLECTOMY     VAGINAL HYSTERECTOMY      Current Medications: Current Meds  Medication Sig   amphetamine-dextroamphetamine (ADDERALL) 15 MG tablet Take 1 tablet by mouth daily with breakfast.   clonazePAM (KLONOPIN) 0.5 MG tablet Take 1 tablet (0.5 mg total) by mouth 2 (two) times daily.   metoprolol succinate (TOPROL XL) 25 MG 24 hr tablet Take 0.5 tablets (12.5 mg total) by mouth daily.   ondansetron (ZOFRAN ODT) 4 MG disintegrating tablet Take 1 tablet (4 mg total) by mouth every 8 (eight) hours as needed for nausea or vomiting.   SUMAtriptan (IMITREX) 100 MG tablet Take 1 tablet (100 mg total) by mouth every 2 (two) hours as needed for migraine. May repeat in 2 hours if headache persists or recurs.     Allergies:    Penicillins, Aspirin, Aimovig [erenumab-aooe], and Pyridium [phenazopyridine]   Social History   Socioeconomic History   Marital status: Married    Spouse name: Not on file   Number of children: Not on file   Years of education: Not on file   Highest education level: Not on file  Occupational History   Not on file  Tobacco Use   Smoking status: Every Day    Packs/day: 0.50    Pack years: 0.00    Types: Cigarettes   Smokeless tobacco: Never  Vaping Use   Vaping Use: Never used  Substance and Sexual Activity   Alcohol use: Yes    Alcohol/week: 1.0 standard drink    Types: 1 Standard drinks or equivalent per week    Comment: rarely   Drug use: Not Currently   Sexual activity: Not Currently  Other Topics Concern   Not on file  Social History Narrative   Right handed   Lives in one story home with husband   Social Determinants of Health   Financial Resource Strain: Not on file  Food Insecurity: Not on file  Transportation Needs: Not on file  Physical Activity: Not on file  Stress: Not on file  Social Connections: Not on file     Family History: The patient's family history includes Breast cancer in her mother; CVA in her father; Hyperlipidemia in her father;  Hypertension in her father; Hypothyroidism in her mother.  ROS:   Review of Systems  Constitution: Negative for decreased appetite, fever and weight gain.  HENT: Negative for congestion, ear discharge, hoarse voice and sore throat.   Eyes: Negative for discharge, redness, vision loss in right eye and visual halos.  Cardiovascular: Negative for chest pain, dyspnea on exertion, leg swelling, orthopnea and palpitations.  Respiratory: Negative for cough, hemoptysis, shortness of breath and snoring.   Endocrine: Negative for heat intolerance and polyphagia.  Hematologic/Lymphatic: Negative for bleeding problem. Does not bruise/bleed easily.  Skin: Negative for flushing, nail changes, rash and suspicious lesions.   Musculoskeletal: Negative for arthritis, joint pain, muscle cramps, myalgias, neck pain and stiffness.  Gastrointestinal: Negative for abdominal pain, bowel incontinence, diarrhea and excessive appetite.  Genitourinary: Negative for decreased libido, genital sores and incomplete emptying.  Neurological: Negative for brief paralysis, focal weakness, headaches and loss of balance.  Psychiatric/Behavioral: Negative for altered mental status, depression and suicidal ideas.  Allergic/Immunologic: Negative for HIV exposure and persistent infections.    EKGs/Labs/Other Studies Reviewed:    The following studies were reviewed today:   EKG: None today ZIO monitor Patch Wear Time:  13 days and 16 hours starting Apr 20, 2021. Indication: Palpitations   Patient had a min HR of 54 bpm, max HR of 240 bpm, and avg HR of 82 bpm. Predominant underlying rhythm was Sinus Rhythm.   1 run of Ventricular Tachycardia occurred lasting 5 beats with a maximum rate of 190 bpm (average 183 bpm).   16 Supraventricular Tachycardia runs occurred, the run with the fastest interval lasting 4 beats with a maximum rate of 240 bpm, the longest lasting 5 beats with an average rate of 104 bpm.   Premature atrial complexes were rare. Premature ventricular complexes were rare.   Symptoms were associated with sinus rhythm, cardiac and rare premature atrial complex.   No atrial fibrillation, and no pauses.   Conclusion- This study is remarkable for the following:                                  1.  Nonsustained ventricular tachycardia                                   2.  Paroxysmal supraventricular tachycardia which is likely atrial tachycardia.  Recent Labs: 05/18/2021: BUN 12; Creatinine, Ser 0.65; Magnesium 1.9; Potassium 4.3; Sodium 138  Recent Lipid Panel No results found for: CHOL, TRIG, HDL, CHOLHDL, VLDL, LDLCALC, LDLDIRECT  Physical Exam:    VS:  BP 110/80   Pulse 75   Ht  (1.676 m)   Wt 214 lb  6.4 oz (97.3 kg)   SpO2 98%   BMI 34.61 kg/m     Wt Readings from Last 3 Encounters:  06/01/21 214 lb 6.4 oz (97.3 kg)  05/07/21 215 lb (97.5 kg)  04/20/21 218 lb 6.4 oz (99.1 kg)     GEN: Well nourished, well developed in no acute distress HEENT: Normal NECK: No JVD; No carotid bruits LYMPHATICS: No lymphadenopathy CARDIAC: S1S2 noted,RRR, no murmurs, rubs, gallops RESPIRATORY:  Clear to auscultation without rales, wheezing or rhonchi  ABDOMEN: Soft, non-tender, non-distended, +bowel sounds, no guarding. EXTREMITIES: No edema, No cyanosis, no clubbing MUSCULOSKELETAL:  No deformity  SKIN: Warm and dry NEUROLOGIC:  Alert and oriented x 3, non-focal  PSYCHIATRIC:  Normal affect, good insight  ASSESSMENT:    1. Precordial pain   2. Shortness of breath   3. Fatigue, unspecified type   4. NSVT (nonsustained ventricular tachycardia) (HCC)   5. PAT (paroxysmal atrial tachycardia) (HCC)    PLAN:    We talked about her monitor findings which show evidence of NSVT as well as paroxysmal atrial tachycardia.  I like to start patient on low-dose beta-blocker Toprol-XL 12.5 mg daily.  In addition she still need ischemic evaluation she was unable to get a coronary CTA due to heart rate.  We will get a pharmacologic nuclear stress test in this patient.  She is agreeable to proceed with this. For shortness of breath and worsening fatigue we will get an echocardiogram for completeness.   The patient is in agreement with the above plan. The patient left the office in stable condition.  The patient will follow up in 12 weeks or sooner if needed.   Medication Adjustments/Labs and Tests Ordered: Current medicines are reviewed at length with the patient today.  Concerns regarding medicines are outlined above.  Orders Placed This Encounter  Procedures   VITAMIN D 25 Hydroxy (Vit-D Deficiency, Fractures)   Cardiac Stress Test: Informed Consent Details: Physician/Practitioner Attestation;  Transcribe to consent form and obtain patient signature   MYOCARDIAL PERFUSION IMAGING   ECHOCARDIOGRAM COMPLETE   Meds ordered this encounter  Medications   metoprolol succinate (TOPROL XL) 25 MG 24 hr tablet    Sig: Take 0.5 tablets (12.5 mg total) by mouth daily.    Dispense:  45 tablet    Refill:  3    Patient Instructions  Medication Instructions:  Your physician has recommended you make the following change in your medication: START: TOPROL XL 12.5 mg take 1/2 tablet by mouth daily.  *If you need a refill on your cardiac medications before your next appointment, please call your pharmacy*   Lab Work: Your physician recommends that you return for lab work in: TODAY Vitamin D If you have labs (blood work) drawn today and your tests are completely normal, you will receive your results only by: MyChart Message (if you have MyChart) OR A paper copy in the mail If you have any lab test that is abnormal or we need to change your treatment, we will call you to review the results.   Testing/Procedures: Your physician has requested that you have an echocardiogram. Echocardiography is a painless test that uses sound waves to create images of your heart. It provides your doctor with information about the size and shape of your heart and how well your heart's chambers and valves are working. This procedure takes approximately one hour. There are no restrictions for this procedure.    [facility] Nuclear Imaging [address] [place], [place] [address] Phone:  [phone]    Please arrive 15 minutes prior to your appointment time for registration and insurance purposes.  The test will take approximately 3 to 4 hours to complete; you may bring reading material.  If someone comes with you to your appointment, they will need to remain in the main lobby due to limited space in the testing area. **If you are pregnant or breastfeeding, please notify the nuclear lab prior to  your appointment**  How to prepare for your Myocardial Perfusion Test: Do not eat or drink 3 hours prior to your test, except you may have water. Do not consume products containing caffeine (regular or decaffeinated) 12 hours prior to your  test. (ex: coffee, chocolate, sodas, tea). Do bring a list of your current medications with you.  If not listed below, you may take your medications as normal. Do wear comfortable clothes (no dresses or overalls) and walking shoes, tennis shoes preferred (No heels or open toe shoes are allowed). Do NOT wear cologne, perfume, aftershave, or lotions (deodorant is allowed). If these instructions are not followed, your test will have to be rescheduled.  Please report to [address] for your test.  If you have questions or concerns about your appointment, you can call the [facility] Lab at [phone].  If you cannot keep your appointment, please provide 24 hours notification to the Nuclear Lab, to avoid a possible $50 charge to your account.    Follow-Up: At [facility], you and your health needs are our priority.  As part of our continuing mission to provide you with exceptional heart care, we have created designated Provider Care Teams.  These Care Teams include your primary Cardiologist (physician) and Advanced Practice Providers (APPs -  Physician Assistants and Nurse Practitioners) who all work together to provide you with the care you need, when you need it.  We recommend signing up for the patient portal called "MyChart".  Sign up information is provided on this After Visit Summary.  MyChart is used to connect with patients for Virtual Visits (Telemedicine).  Patients are able to view lab/test results, encounter notes, upcoming appointments, etc.  Non-urgent messages can be sent to your provider as well.   To learn more about what you can do with MyChart, go to [email or web address].    Your next appointment:    12 week(s)  The format for your next appointment:   In Person  Provider:   Thomasene Ripple, DO   Other Instructions    Adopting a Healthy Lifestyle.  Know what a healthy weight is for you (roughly BMI <25) and aim to maintain this   Aim for 7+ servings of fruits and vegetables daily   65-80+ fluid ounces of water or unsweet tea for healthy kidneys   Limit to max 1 drink of alcohol per day; avoid smoking/tobacco   Limit animal fats in diet for cholesterol and heart health - choose grass fed whenever available   Avoid highly processed foods, and foods high in saturated/trans fats   Aim for low stress - take time to unwind and care for your mental health   Aim for 150 min of moderate intensity exercise weekly for heart health, and weights twice weekly for bone health   Aim for 7-9 hours of sleep daily   When it comes to diets, agreement about the perfect plan isnt easy to find, even among the experts. Experts at the [facility] of [employer] developed an idea known as the Healthy Eating Plate. Just imagine a plate divided into logical, healthy portions.   The emphasis is on diet quality:   Load up on vegetables and fruits - one-half of your plate: Aim for color and variety, and remember that potatoes dont count.   Go for whole grains - one-quarter of your plate: Whole wheat, barley, wheat berries, quinoa, oats, brown rice, and foods made with them. If you want pasta, go with whole wheat pasta.   Protein power - one-quarter of your plate: Fish, chicken, beans, and nuts are all healthy, versatile protein sources. Limit red meat.   The diet, however, does go beyond the plate, offering a few other suggestions.  Use healthy plant oils, such as olive, canola, soy, corn, sunflower and peanut. Check the labels, and avoid partially hydrogenated oil, which have unhealthy trans fats.   If youre thirsty, drink water. Coffee and tea are good in moderation, but skip sugary  drinks and limit milk and dairy products to one or two daily servings.   The type of carbohydrate in the diet is more important than the amount. Some sources of carbohydrates, such as vegetables, fruits, whole grains, and beans-are healthier than others.   Finally, stay active  Signed, Thomasene RippleKardie Tateanna Bach, DO  06/01/2021 1:33 PM    [facility]

## 2021-06-02 ENCOUNTER — Telehealth: Payer: Self-pay | Admitting: *Deleted

## 2021-06-02 LAB — VITAMIN D 25 HYDROXY (VIT D DEFICIENCY, FRACTURES): Vit D, 25-Hydroxy: 29 ng/mL — ABNORMAL LOW (ref 30.0–100.0)

## 2021-06-02 NOTE — Telephone Encounter (Signed)
Left message on voicemail per DPR in reference to upcoming appointment scheduled on 06/09/21 at 0745 with detailed instructions given per Myocardial Perfusion Study Information Sheet for the test. LM to arrive 15 minutes early, and that it is imperative to arrive on time for appointment to keep from having the test rescheduled. If you need to cancel or reschedule your appointment, please call the office within 24 hours of your appointment. Failure to do so may result in a cancellation of your appointment, and a $50 no show fee. Phone number given for call back for any questions. Blessed Cotham, Adelene Idler No mychart available.

## 2021-06-07 ENCOUNTER — Other Ambulatory Visit: Payer: Self-pay

## 2021-06-07 MED ORDER — VITAMIN D (ERGOCALCIFEROL) 1.25 MG (50000 UNIT) PO CAPS: 50000.0000 [IU] | ORAL_CAPSULE | ORAL | 0 refills | Status: DC

## 2021-06-07 NOTE — Progress Notes (Signed)
Prescription sent to pharmacy.

## 2021-06-09 ENCOUNTER — Other Ambulatory Visit: Payer: Self-pay

## 2021-06-09 ENCOUNTER — Ambulatory Visit (INDEPENDENT_AMBULATORY_CARE_PROVIDER_SITE_OTHER): Payer: BC Managed Care – PPO

## 2021-06-09 DIAGNOSIS — R5383 Other fatigue: Secondary | ICD-10-CM | POA: Diagnosis not present

## 2021-06-09 DIAGNOSIS — R0602 Shortness of breath: Secondary | ICD-10-CM | POA: Diagnosis not present

## 2021-06-09 MED ORDER — TECHNETIUM TC 99M TETROFOSMIN IV KIT
31.6000 | PACK | Freq: Once | INTRAVENOUS | Status: AC | PRN
  Administered 2021-06-09: 31.6 via INTRAVENOUS

## 2021-06-09 MED ORDER — REGADENOSON 0.4 MG/5ML IV SOLN
0.4000 mg | Freq: Once | INTRAVENOUS | Status: AC
  Administered 2021-06-09: 0.4 mg via INTRAVENOUS

## 2021-06-10 ENCOUNTER — Ambulatory Visit: Payer: BC Managed Care – PPO

## 2021-06-10 MED ORDER — TECHNETIUM TC 99M TETROFOSMIN IV KIT
31.0000 | PACK | Freq: Once | INTRAVENOUS | Status: AC | PRN
  Administered 2021-06-10: 31 via INTRAVENOUS

## 2021-06-11 ENCOUNTER — Other Ambulatory Visit: Payer: Self-pay | Admitting: Neurology

## 2021-06-11 ENCOUNTER — Telehealth: Payer: Self-pay | Admitting: Neurology

## 2021-06-11 ENCOUNTER — Telehealth: Payer: Self-pay

## 2021-06-11 LAB — MYOCARDIAL PERFUSION IMAGING
LV dias vol: 62 mL (ref 46–106)
LV sys vol: 20 mL
Peak HR: 101 {beats}/min
Rest HR: 70 {beats}/min
SDS: 1
SRS: 0
SSS: 1
TID: 1.04

## 2021-06-11 MED ORDER — EMGALITY 120 MG/ML ~~LOC~~ SOAJ: 240.0000 mg | Freq: Once | SUBCUTANEOUS | 0 refills | Status: AC

## 2021-06-11 MED ORDER — EMGALITY 120 MG/ML ~~LOC~~ SOAJ: 120.0000 mg | SUBCUTANEOUS | 5 refills | Status: DC

## 2021-06-11 NOTE — Telephone Encounter (Signed)
-----   Message from Drema Dallas, DO sent at 06/11/2021  7:45 AM EDT ----- Let patient know I sent a prescription to her pharmacy - first dose is 2 injections, then 1 injection every 28 days thereafter ----- Message ----- From: Leida Lauth, CMA Sent: 06/10/2021   4:29 PM EDT To: Drema Dallas, DO  Pt Willing to try the Emaglity.  ----- Message ----- From: Drema Dallas, DO Sent: 06/10/2021   3:51 PM EDT To: Leida Lauth, CMA  Jeanette Alvarado  Insurance denied Vyepti after trying to increase to 300mg .  Apparently they want her to have tried all three injectables (she only tried Aimovig).  Can we check to see if patient willing to try another injectable (Emgality or Ajovy)?

## 2021-06-11 NOTE — Telephone Encounter (Signed)
Denied Vyepti.  Start [employer] - script sent

## 2021-06-11 NOTE — Telephone Encounter (Signed)
Pt advised of script and instructions how to take.

## 2021-06-21 ENCOUNTER — Telehealth: Payer: Self-pay

## 2021-06-21 NOTE — Telephone Encounter (Signed)
Patient called to see if she could bee seen tomorrow since she is having her echo tomorrow. She was informed it would be best to be seen later in the week so Dr. Servando Salina could discuss her echo in person. She verbalized understanding, she was added to Friday 15th.

## 2021-06-22 ENCOUNTER — Ambulatory Visit (INDEPENDENT_AMBULATORY_CARE_PROVIDER_SITE_OTHER): Payer: BC Managed Care – PPO

## 2021-06-22 ENCOUNTER — Other Ambulatory Visit: Payer: Self-pay

## 2021-06-22 DIAGNOSIS — R5383 Other fatigue: Secondary | ICD-10-CM

## 2021-06-22 DIAGNOSIS — R0602 Shortness of breath: Secondary | ICD-10-CM | POA: Diagnosis not present

## 2021-06-22 LAB — ECHOCARDIOGRAM COMPLETE
Area-P 1/2: 4.6 cm2
S' Lateral: 3.2 cm

## 2021-06-25 ENCOUNTER — Ambulatory Visit: Payer: BC Managed Care – PPO | Admitting: Cardiology

## 2021-06-25 ENCOUNTER — Encounter: Payer: Self-pay | Admitting: Cardiology

## 2021-06-25 ENCOUNTER — Other Ambulatory Visit: Payer: Self-pay

## 2021-06-25 ENCOUNTER — Other Ambulatory Visit: Payer: Self-pay | Admitting: Legal Medicine

## 2021-06-25 VITALS — BP 116/58 | HR 90 | Ht 66.0 in | Wt 214.8 lb

## 2021-06-25 DIAGNOSIS — I479 Paroxysmal tachycardia, unspecified: Secondary | ICD-10-CM | POA: Diagnosis not present

## 2021-06-25 DIAGNOSIS — I472 Ventricular tachycardia: Secondary | ICD-10-CM

## 2021-06-25 DIAGNOSIS — E785 Hyperlipidemia, unspecified: Secondary | ICD-10-CM | POA: Diagnosis not present

## 2021-06-25 DIAGNOSIS — I493 Ventricular premature depolarization: Secondary | ICD-10-CM

## 2021-06-25 DIAGNOSIS — E669 Obesity, unspecified: Secondary | ICD-10-CM | POA: Insufficient documentation

## 2021-06-25 DIAGNOSIS — I4729 Other ventricular tachycardia: Secondary | ICD-10-CM

## 2021-06-25 DIAGNOSIS — F900 Attention-deficit hyperactivity disorder, predominantly inattentive type: Secondary | ICD-10-CM

## 2021-06-25 DIAGNOSIS — I471 Supraventricular tachycardia: Secondary | ICD-10-CM | POA: Insufficient documentation

## 2021-06-25 MED ORDER — DILTIAZEM HCL ER COATED BEADS 120 MG PO CP24: 120.0000 mg | ORAL_CAPSULE | Freq: Every day | ORAL | 3 refills | Status: DC

## 2021-06-25 NOTE — Progress Notes (Signed)
Cardiology Office Note:    Date:  06/25/2021   ID:  Jeanette Alvarado, DOB [birth year], MRN [id number]  PCP:  Abigail Miyamoto, MD  Cardiologist:  Thomasene Ripple, DO  Electrophysiologist:  None   Referring MD: Abigail Miyamoto,*   Since I started new medicine I feel like my symptoms is getting worse.  I am having worsening palpitations and sometimes dry mouth.   History of Present Illness:    Jeanette Alvarado is a 50 y.o. female with a hx of obesity, depression, premature family history of coronary artery disease is here today for follow-up visit.  At her initial visit the patient was experiencing palpitation chest pain I sent her for coronary CTA unfortunately her heart rate did not allow [place] to get this testing done.  She had a monitor which she wore show evidence of nonsustained ventricular tachycardia and paroxysmal atrial tachycardia.  I started her on low-dose Toprol at her last visit.  I also set up the patient for a nuclear stress test which she did and came back normal.  Her echocardiogram which was also done recently was normal as well.  Today she tells me that she has been experiencing worsening symptoms since she started on metoprolol.  She feels like her heart racing has gotten worse.  She is got dry mouth nausea and every time she takes the medication.  This is why she requested to come to be seen today.  She tells me that her symptoms has been really interfering with her day-to-day activity.  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  I therefore discussed with the patient that what she could do is have someone chaperone with her if she is going to places where nobody else have access to it.  She is agreeable to do that.   Past Medical History:  Diagnosis Date   ADHD    Aortic dilatation (HCC) 04/07/2021   Found on CT scan 2022   Ascending aortic aneurysm (HCC) 04/20/2021   BMI 36.0-36.9,adult  07/09/2020   Chest pain 04/08/2021   Family history of premature CAD 04/20/2021   Fatigue 06/01/2021   GERD (gastroesophageal reflux disease) 04/08/2021   Major depressive disorder, recurrent, moderate (HCC)    Migraine without aura, not intractable, without status migrainosus    NSVT (nonsustained ventricular tachycardia) (HCC) 06/01/2021   Palpitations 04/20/2021   PAT (paroxysmal atrial tachycardia) (HCC) 06/01/2021   PVC's (premature ventricular contractions) 04/20/2021   Shortness of breath 06/01/2021   Shoulder pain, left 01/20/2021   Tobacco use 04/20/2021    Past Surgical History:  Procedure Laterality Date   exploratory laparotomy multiple times for endometriosis     KNEE ARTHROSCOPY Right    TONSILLECTOMY     VAGINAL HYSTERECTOMY      Current Medications: Current Meds  Medication Sig   amphetamine-dextroamphetamine (ADDERALL) 15 MG tablet Take 1 tablet by mouth daily with breakfast.   clonazePAM (KLONOPIN) 0.5 MG tablet Take 1 tablet (0.5 mg total) by mouth 2 (two) times daily.   diltiazem (CARDIZEM CD) 120 MG 24 hr capsule Take 1 capsule (120 mg total) by mouth daily.   metoprolol succinate (TOPROL XL) 25 MG 24 hr tablet Take 0.5 tablets (12.5 mg total) by mouth daily.   ondansetron (ZOFRAN ODT) 4 MG disintegrating tablet Take 1 tablet (4 mg total) by mouth every 8 (eight) hours as needed for nausea or vomiting.   SUMAtriptan (  IMITREX) 100 MG tablet Take 1 tablet (100 mg total) by mouth every 2 (two) hours as needed for migraine. May repeat in 2 hours if headache persists or recurs.   Vitamin D, Ergocalciferol, (DRISDOL) 1.25 MG (50000 UNIT) CAPS capsule Take 1 capsule (50,000 Units total) by mouth every 7 (seven) days.     Allergies:   Penicillins, Aspirin, Aimovig [erenumab-aooe], and Pyridium [phenazopyridine]   Social History   Socioeconomic History   Marital status: Married    Spouse name: Not on file   Number of children: Not on file   Years of education: Not on file    Highest education level: Not on file  Occupational History   Not on file  Tobacco Use   Smoking status: Every Day    Packs/day: 0.50    Types: Cigarettes   Smokeless tobacco: Never  Vaping Use   Vaping Use: Never used  Substance and Sexual Activity   Alcohol use: Yes    Alcohol/week: 1.0 standard drink    Types: 1 Standard drinks or equivalent per week    Comment: rarely   Drug use: Not Currently   Sexual activity: Not Currently  Other Topics Concern   Not on file  Social History Narrative   Right handed   Lives in one story home with husband   Social Determinants of Health   Financial Resource Strain: Not on file  Food Insecurity: Not on file  Transportation Needs: Not on file  Physical Activity: Not on file  Stress: Not on file  Social Connections: Not on file     Family History: The patient's family history includes Breast cancer in her mother; CVA in her father; Hyperlipidemia in her father; Hypertension in her father; Hypothyroidism in her mother.  ROS:   Review of Systems  Constitution: Negative for decreased appetite, fever and weight gain.  HENT: Negative for congestion, ear discharge, hoarse voice and sore throat.   Eyes: Negative for discharge, redness, vision loss in right eye and visual halos.  Cardiovascular: Negative for chest pain, dyspnea on exertion, leg swelling, orthopnea and palpitations.  Respiratory: Negative for cough, hemoptysis, shortness of breath and snoring.   Endocrine: Negative for heat intolerance and polyphagia.  Hematologic/Lymphatic: Negative for bleeding problem. Does not bruise/bleed easily.  Skin: Negative for flushing, nail changes, rash and suspicious lesions.  Musculoskeletal: Negative for arthritis, joint pain, muscle cramps, myalgias, neck pain and stiffness.  Gastrointestinal: Negative for abdominal pain, bowel incontinence, diarrhea and excessive appetite.  Genitourinary: Negative for decreased libido, genital sores and  incomplete emptying.  Neurological: Negative for brief paralysis, focal weakness, headaches and loss of balance.  Psychiatric/Behavioral: Negative for altered mental status, depression and suicidal ideas.  Allergic/Immunologic: Negative for HIV exposure and persistent infections.    EKGs/Labs/Other Studies Reviewed:    The following studies were reviewed today:   EKG: None today  Pharmacologic nuclear stress test June 10, 2021 Nuclear stress EF: 67%. There was no ST segment deviation noted during stress. No T wave inversion was noted during stress. The left ventricular ejection fraction is normal (55-65%). The study is normal. This is a low risk study.   Transthoracic echocardiogram 06/22/2021 IMPRESSIONS   1. Left ventricular ejection fraction, by estimation, is 60 to 65%. The left ventricle has normal function. The left ventricle has no regional  wall motion abnormalities. Left ventricular diastolic parameters were normal.   2. Right ventricular systolic function is normal. The right ventricular size is normal. There is normal pulmonary artery systolic  pressure.   3. The mitral valve is normal in structure. No evidence of mitral valve regurgitation. No evidence of mitral stenosis.   4. The aortic valve is tricuspid. Aortic valve regurgitation is not visualized. No aortic stenosis is present.   5. The inferior vena cava is normal in size with greater than 50% respiratory variability, suggesting right atrial pressure of 3 mmHg.   FINDINGS   Left Ventricle: Left ventricular ejection fraction, by estimation, is 60  to 65%. The left ventricle has normal function. The left ventricle has no  regional wall motion abnormalities. The left ventricular internal cavity  size was normal in size. There is   no left ventricular hypertrophy. Left ventricular diastolic parameters  were normal. Normal left ventricular filling pressure.   Right Ventricle: The right ventricular size is normal. No  increase in  right ventricular wall thickness. Right ventricular systolic function is  normal. There is normal pulmonary artery systolic pressure. The tricuspid  regurgitant velocity is 2.22 m/s, and   with an assumed right atrial pressure of 3 mmHg, the estimated right  ventricular systolic pressure is 22.7 mmHg.   Left Atrium: Left atrial size was normal in size.   Right Atrium: Right atrial size was normal in size.   Pericardium: There is no evidence of pericardial effusion.   Mitral Valve: The mitral valve is normal in structure. No evidence of  mitral valve regurgitation. No evidence of mitral valve stenosis.   Tricuspid Valve: The tricuspid valve is normal in structure. Tricuspid  valve regurgitation is trivial. No evidence of tricuspid stenosis.   Aortic Valve: The aortic valve is tricuspid. Aortic valve regurgitation is  not visualized. No aortic stenosis is present.   Pulmonic Valve: The pulmonic valve was normal in structure. Pulmonic valve  regurgitation is not visualized. No evidence of pulmonic stenosis.   Aorta: The aortic root, ascending aorta and aortic arch are all  structurally normal, with no evidence of dilitation or obstruction.   Venous: A normal flow pattern is recorded from the right upper pulmonary  vein. The inferior vena cava is normal in size with greater than 50%  respiratory variability, suggesting right atrial pressure of 3 mmHg.   IAS/Shunts: No atrial level shunt detected by color flow Doppler.   ZIO monitor Patch Wear Time:  13 days and 16 hours starting Apr 20, 2021. Indication: Palpitations   Patient had a min HR of 54 bpm, max HR of 240 bpm, and avg HR of 82 bpm. Predominant underlying rhythm was Sinus Rhythm.   1 run of Ventricular Tachycardia occurred lasting 5 beats with a maximum rate of 190 bpm (average 183 bpm).   16 Supraventricular Tachycardia runs occurred, the run with the fastest interval lasting 4 beats with a maximum rate of  240 bpm, the longest lasting 5 beats with an average rate of 104 bpm.   Premature atrial complexes were rare. Premature ventricular complexes were rare.   Symptoms were associated with sinus rhythm, cardiac and rare premature atrial complex.   No atrial fibrillation, and no pauses.   Conclusion- This study is remarkable for the following:                                  1.  Nonsustained ventricular tachycardia  2.  Paroxysmal supraventricular tachycardia which is likely atrial tachycardia.  Recent Labs: 05/18/2021: BUN 12; Creatinine, Ser 0.65; Magnesium 1.9; Potassium 4.3; Sodium 138  Recent Lipid Panel No results found for: CHOL, TRIG, HDL, CHOLHDL, VLDL, LDLCALC, LDLDIRECT  Physical Exam:    VS:  BP (!) 116/58   Pulse 90   Ht 5\' 6"  (1.676 m)   Wt 214 lb 12.8 oz (97.4 kg)   SpO2 97%   BMI 34.67 kg/m     Wt Readings from Last 3 Encounters:  06/25/21 214 lb 12.8 oz (97.4 kg)  06/09/21 214 lb (97.1 kg)  06/01/21 214 lb 6.4 oz (97.3 kg)     GEN: Well nourished, well developed in no acute distress HEENT: Normal NECK: No JVD; No carotid bruits LYMPHATICS: No lymphadenopathy CARDIAC: S1S2 noted,RRR, no murmurs, rubs, gallops RESPIRATORY:  Clear to auscultation without rales, wheezing or rhonchi  ABDOMEN: Soft, non-tender, non-distended, +bowel sounds, no guarding. EXTREMITIES: No edema, No cyanosis, no clubbing MUSCULOSKELETAL:  No deformity  SKIN: Warm and dry NEUROLOGIC:  Alert and oriented x 3, non-focal PSYCHIATRIC:  Normal affect, good insight  ASSESSMENT:    1. Hyperlipidemia, unspecified hyperlipidemia type   2. NSVT (nonsustained ventricular tachycardia) (HCC)   3. Paroxysmal tachycardia (HCC)   4. Obesity (BMI 30-39.9)   5. PVC (premature ventricular contraction)    PLAN:   She is a bit teary in the office today given the symptoms has worsened especially since starting the beta-blocker.  I am going to stop the beta-blocker  today.  We will start the patient on low-dose calcium channel blocker on 120 mg of Cardizem daily.  We will going to monitor the patient's symptoms.  If her symptoms does not improve on this low-dose of calcium channel blocker if her blood pressure can tolerate it we will increase the calcium channel blocker if not we will going to then add antiarrhythmics with flecainide 50 mg twice a day.  We discussed all of her testing results and all of her questions has been answered.  The patient understands the need to lose weight with diet and exercise. We have discussed specific strategies for this.  The patient is in agreement with the above plan. The patient left the office in stable condition.  The patient will follow up in   Medication Adjustments/Labs and Tests Ordered: Current medicines are reviewed at length with the patient today.  Concerns regarding medicines are outlined above.  Orders Placed This Encounter  Procedures   Lipid panel   Meds ordered this encounter  Medications   diltiazem (CARDIZEM CD) 120 MG 24 hr capsule    Sig: Take 1 capsule (120 mg total) by mouth daily.    Dispense:  90 capsule    Refill:  3    Patient Instructions  Medication Instructions:  Your physician has recommended you make the following change in your medication:  STOP Metoprolol START: Cardizem 120 mg once daily  *If you need a refill on your cardiac medications before your next appointment, please call your pharmacy*   Lab Work: Your physician recommends that you return for lab work in:  2-3 weeks: Lipids  If you have labs (blood work) drawn today and your tests are completely normal, you will receive your results only by: MyChart Message (if you have MyChart) OR A paper copy in the mail If you have any lab test that is abnormal or we need to change your treatment, we will call you to review the results.  Testing/Procedures: None   Follow-Up: At [facility], you and your health  needs are our priority.  As part of our continuing mission to provide you with exceptional heart care, we have created designated Provider Care Teams.  These Care Teams include your primary Cardiologist (physician) and Advanced Practice Providers (APPs -  Physician Assistants and Nurse Practitioners) who all work together to provide you with the care you need, when you need it.  We recommend signing up for the patient portal called "MyChart".  Sign up information is provided on this After Visit Summary.  MyChart is used to connect with patients for Virtual Visits (Telemedicine).  Patients are able to view lab/test results, encounter notes, upcoming appointments, etc.  Non-urgent messages can be sent to your provider as well.   To learn more about what you can do with MyChart, go to [email or web address].    Your next appointment:   3 week(s)  The format for your next appointment:   Virtual Visit   Provider:   Thomasene RippleKardie Frankey Botting, DO   Other Instructions    Adopting a Healthy Lifestyle.  Know what a healthy weight is for you (roughly BMI <25) and aim to maintain this   Aim for 7+ servings of fruits and vegetables daily   65-80+ fluid ounces of water or unsweet tea for healthy kidneys   Limit to max 1 drink of alcohol per day; avoid smoking/tobacco   Limit animal fats in diet for cholesterol and heart health - choose grass fed whenever available   Avoid highly processed foods, and foods high in saturated/trans fats   Aim for low stress - take time to unwind and care for your mental health   Aim for 150 min of moderate intensity exercise weekly for heart health, and weights twice weekly for bone health   Aim for 7-9 hours of sleep daily   When it comes to diets, agreement about the perfect plan isnt easy to find, even among the experts. Experts at the [employer] developed an idea known as the Healthy Eating Plate. Just imagine a plate divided into logical, healthy  portions.   The emphasis is on diet quality:   Load up on vegetables and fruits - one-half of your plate: Aim for color and variety, and remember that potatoes dont count.   Go for whole grains - one-quarter of your plate: Whole wheat, barley, wheat berries, quinoa, oats, brown rice, and foods made with them. If you want pasta, go with whole wheat pasta.   Protein power - one-quarter of your plate: Fish, chicken, beans, and nuts are all healthy, versatile protein sources. Limit red meat.   The diet, however, does go beyond the plate, offering a few other suggestions.   Use healthy plant oils, such as olive, canola, soy, corn, sunflower and peanut. Check the labels, and avoid partially hydrogenated oil, which have unhealthy trans fats.   If youre thirsty, drink water. Coffee and tea are good in moderation, but skip sugary drinks and limit milk and dairy products to one or two daily servings.   The type of carbohydrate in the diet is more important than the amount. Some sources of carbohydrates, such as vegetables, fruits, whole grains, and beans-are healthier than others.   Finally, stay active  Signed, Thomasene RippleKardie Labradford Schnitker, DO  06/25/2021 4:23 PM    [facility]

## 2021-06-25 NOTE — Patient Instructions (Signed)
Medication Instructions:  Your physician has recommended you make the following change in your medication:  STOP Metoprolol START: Cardizem 120 mg once daily  *If you need a refill on your cardiac medications before your next appointment, please call your pharmacy*   Lab Work: Your physician recommends that you return for lab work in:  2-3 weeks: Lipids  If you have labs (blood work) drawn today and your tests are completely normal, you will receive your results only by: MyChart Message (if you have MyChart) OR A paper copy in the mail If you have any lab test that is abnormal or we need to change your treatment, we will call you to review the results.   Testing/Procedures: None   Follow-Up: At [facility], you and your health needs are our priority.  As part of our continuing mission to provide you with exceptional heart care, we have created designated Provider Care Teams.  These Care Teams include your primary Cardiologist (physician) and Advanced Practice Providers (APPs -  Physician Assistants and Nurse Practitioners) who all work together to provide you with the care you need, when you need it.  We recommend signing up for the patient portal called "MyChart".  Sign up information is provided on this After Visit Summary.  MyChart is used to connect with patients for Virtual Visits (Telemedicine).  Patients are able to view lab/test results, encounter notes, upcoming appointments, etc.  Non-urgent messages can be sent to your provider as well.   To learn more about what you can do with MyChart, go to [email or web address].    Your next appointment:   3 week(s)  The format for your next appointment:   Virtual Visit   Provider:   Thomasene Ripple, DO   Other Instructions

## 2021-06-28 MED ORDER — AMPHETAMINE-DEXTROAMPHETAMINE 15 MG PO TABS: 15.0000 mg | ORAL_TABLET | Freq: Every day | ORAL | 0 refills | Status: DC

## 2021-07-08 LAB — LIPID PANEL
Chol/HDL Ratio: 3.8 ratio (ref 0.0–4.4)
Cholesterol, Total: 204 mg/dL — ABNORMAL HIGH (ref 100–199)
HDL: 54 mg/dL (ref >39)
LDL Chol Calc (NIH): 131 mg/dL — ABNORMAL HIGH (ref 0–99)
Triglycerides: 107 mg/dL (ref 0–149)
VLDL Cholesterol Cal: 19 mg/dL (ref 5–40)

## 2021-07-12 ENCOUNTER — Telehealth: Payer: Self-pay

## 2021-07-12 DIAGNOSIS — E785 Hyperlipidemia, unspecified: Secondary | ICD-10-CM

## 2021-07-12 MED ORDER — ROSUVASTATIN CALCIUM 5 MG PO TABS: 5.0000 mg | ORAL_TABLET | Freq: Every day | ORAL | 3 refills | Status: DC

## 2021-07-12 NOTE — Telephone Encounter (Signed)
Spoke with patient regarding results and recommendation.  Patient verbalizes understanding and is agreeable to plan of care. Advised patient to call back with any issues or concerns.  

## 2021-07-12 NOTE — Telephone Encounter (Signed)
-----   Message from Thomasene Ripple, DO sent at 07/12/2021 12:56 PM EDT ----- Your LDL is still elevated 131, total cholesterol slightly elevated.  You could benefit from low-dose Crestor 5 mg daily.  If not we can repeat this in 12 weeks and have a try diet modification.

## 2021-07-13 ENCOUNTER — Encounter: Payer: Self-pay | Admitting: Cardiology

## 2021-07-13 ENCOUNTER — Other Ambulatory Visit: Payer: Self-pay

## 2021-07-13 ENCOUNTER — Telehealth: Payer: BC Managed Care – PPO | Admitting: Cardiology

## 2021-07-13 VITALS — BP 116/58 | HR 90 | Ht 66.0 in | Wt 212.0 lb

## 2021-07-13 DIAGNOSIS — I4729 Other ventricular tachycardia: Secondary | ICD-10-CM

## 2021-07-13 DIAGNOSIS — I712 Thoracic aortic aneurysm, without rupture: Secondary | ICD-10-CM

## 2021-07-13 DIAGNOSIS — I493 Ventricular premature depolarization: Secondary | ICD-10-CM

## 2021-07-13 DIAGNOSIS — I7121 Aneurysm of the ascending aorta, without rupture: Secondary | ICD-10-CM

## 2021-07-13 DIAGNOSIS — Z6836 Body mass index (BMI) 36.0-36.9, adult: Secondary | ICD-10-CM

## 2021-07-13 DIAGNOSIS — I471 Supraventricular tachycardia: Secondary | ICD-10-CM | POA: Diagnosis not present

## 2021-07-13 DIAGNOSIS — I472 Ventricular tachycardia: Secondary | ICD-10-CM | POA: Diagnosis not present

## 2021-07-13 MED ORDER — DILTIAZEM HCL 30 MG PO TABS: 30.0000 mg | ORAL_TABLET | Freq: Three times a day (TID) | ORAL | 3 refills | Status: DC

## 2021-07-13 NOTE — Patient Instructions (Signed)
Medication Instructions:  Your physician has recommended you make the following change in your medication:  STOP: Cardizem CD  START: Cardizem 30 mg take one tablet by mouth every 8 hours.  *If you need a refill on your cardiac medications before your next appointment, please call your pharmacy*   Lab Work: None If you have labs (blood work) drawn today and your tests are completely normal, you will receive your results only by: MyChart Message (if you have MyChart) OR A paper copy in the mail If you have any lab test that is abnormal or we need to change your treatment, we will call you to review the results.   Testing/Procedures: None   Follow-Up: At [facility], you and your health needs are our priority.  As part of our continuing mission to provide you with exceptional heart care, we have created designated Provider Care Teams.  These Care Teams include your primary Cardiologist (physician) and Advanced Practice Providers (APPs -  Physician Assistants and Nurse Practitioners) who all work together to provide you with the care you need, when you need it.  We recommend signing up for the patient portal called "MyChart".  Sign up information is provided on this After Visit Summary.  MyChart is used to connect with patients for Virtual Visits (Telemedicine).  Patients are able to view lab/test results, encounter notes, upcoming appointments, etc.  Non-urgent messages can be sent to your provider as well.   To learn more about what you can do with MyChart, go to [email or web address].    Your next appointment:   12 week(s)  The format for your next appointment:   In Person  Provider:   Thomasene Ripple, DO   Other Instructions

## 2021-07-13 NOTE — Progress Notes (Signed)
Virtual Visit via Video Note   This visit type was conducted due to national recommendations for restrictions regarding the COVID-19 Pandemic (e.g. social distancing) in an effort to limit this patient's exposure and mitigate transmission in our community.  Due to her co-morbid illnesses, this patient is at least at moderate risk for complications without adequate follow up.  This format is felt to be most appropriate for this patient at this time.  All issues noted in this document were discussed and addressed.  A limited physical exam was performed with this format.  Please refer to the patient's chart for her consent to telehealth for [facility].      Date:  07/13/2021   ID:  Jeanette Alvarado, DOB [birth year], MRN [id number]  Patient Location: Home virtual Visit via Video I connected with the patient on July 13, 2021 by a video enabled telemedicine application and verified that I am speaking with the correct person using two identifiers.   Provider Location: Office/Clinic  PCP:  Abigail Miyamoto, MD  Cardiologist:  Thomasene Ripple, DO  Electrophysiologist:  None   Evaluation Performed:  Follow-Up Visit  Chief Complaint:  " my heart rate is getting very low"  History of Present Illness:    Jeanette Alvarado is a 50 y.o. female with  obesity, depression, premature family history of coronary artery disease is here today for follow-up visit.  At her initial visit the patient was experiencing palpitation chest pain I sent her for coronary CTA unfortunately her heart rate did not allow [place] to get this testing done.  She had a monitor which she wore show evidence of nonsustained ventricular tachycardia and paroxysmal atrial tachycardia.  She was started on Toprol-XL but did not tolerate this medication and has since been transitioned to Cardizem 120 mg daily.  She is here today for follow-up visit via virtual visit.  She tells me that her palpitations has improved significantly but she  noticed her heart rate is going down in the 40s on a new medication.  She is concerned.  No other complaints at this time.   The patient does not have symptoms concerning for COVID-19 infection (fever, chills, cough, or new shortness of breath).    Past Medical History:  Diagnosis Date   ADHD    Aortic dilatation (HCC) 04/07/2021   Found on CT scan 2022   Ascending aortic aneurysm (HCC) 04/20/2021   BMI 36.0-36.9,adult 07/09/2020   Chest pain 04/08/2021   Family history of premature CAD 04/20/2021   Fatigue 06/01/2021   GERD (gastroesophageal reflux disease) 04/08/2021   Major depressive disorder, recurrent, moderate (HCC)    Migraine without aura, not intractable, without status migrainosus    NSVT (nonsustained ventricular tachycardia) (HCC) 06/01/2021   Palpitations 04/20/2021   PAT (paroxysmal atrial tachycardia) (HCC) 06/01/2021   PVC's (premature ventricular contractions) 04/20/2021   Shortness of breath 06/01/2021   Shoulder pain, left 01/20/2021   Tobacco use 04/20/2021   Past Surgical History:  Procedure Laterality Date   exploratory laparotomy multiple times for endometriosis     KNEE ARTHROSCOPY Right    TONSILLECTOMY     VAGINAL HYSTERECTOMY       Current Meds  Medication Sig   amphetamine-dextroamphetamine (ADDERALL) 15 MG tablet Take 1 tablet by mouth daily with breakfast. (Patient taking differently: Take 15 mg by mouth as needed (when working).)   clonazePAM (KLONOPIN) 0.5 MG tablet Take 1 tablet (0.5 mg total) by mouth 2 (two) times daily. (Patient taking differently:  Take 0.5 mg by mouth as needed for anxiety.)   diltiazem (CARDIZEM CD) 120 MG 24 hr capsule Take 1 capsule (120 mg total) by mouth daily.   ondansetron (ZOFRAN ODT) 4 MG disintegrating tablet Take 1 tablet (4 mg total) by mouth every 8 (eight) hours as needed for nausea or vomiting.   rosuvastatin (CRESTOR) 5 MG tablet Take 1 tablet (5 mg total) by mouth daily.   SUMAtriptan (IMITREX) 100 MG tablet Take 1  tablet (100 mg total) by mouth every 2 (two) hours as needed for migraine. May repeat in 2 hours if headache persists or recurs.   Vitamin D, Ergocalciferol, (DRISDOL) 1.25 MG (50000 UNIT) CAPS capsule Take 1 capsule (50,000 Units total) by mouth every 7 (seven) days.     Allergies:   Penicillins, Aspirin, Aimovig [erenumab-aooe], and Pyridium [phenazopyridine]   Social History   Tobacco Use   Smoking status: Every Day    Packs/day: 0.50    Types: Cigarettes   Smokeless tobacco: Never  Vaping Use   Vaping Use: Never used  Substance Use Topics   Alcohol use: Yes    Alcohol/week: 1.0 standard drink    Types: 1 Standard drinks or equivalent per week    Comment: rarely   Drug use: Not Currently     Family Hx: The patient's family history includes Breast cancer in her mother; CVA in her father; Hyperlipidemia in her father; Hypertension in her father; Hypothyroidism in her mother.  ROS:   Please see the history of present illness.     All other systems reviewed and are negative.   Prior CV studies:   The following studies were reviewed today: Pharmacologic nuclear stress test June 10, 2021 Nuclear stress EF: 67%. There was no ST segment deviation noted during stress. No T wave inversion was noted during stress. The left ventricular ejection fraction is normal (55-65%). The study is normal. This is a low risk study.     Transthoracic echocardiogram 06/22/2021 IMPRESSIONS   1. Left ventricular ejection fraction, by estimation, is 60 to 65%. The left ventricle has normal function. The left ventricle has no regional  wall motion abnormalities. Left ventricular diastolic parameters were normal.   2. Right ventricular systolic function is normal. The right ventricular size is normal. There is normal pulmonary artery systolic pressure.   3. The mitral valve is normal in structure. No evidence of mitral valve regurgitation. No evidence of mitral stenosis.   4. The aortic valve is  tricuspid. Aortic valve regurgitation is not visualized. No aortic stenosis is present.   5. The inferior vena cava is normal in size with greater than 50% respiratory variability, suggesting right atrial pressure of 3 mmHg.   FINDINGS   Left Ventricle: Left ventricular ejection fraction, by estimation, is 60  to 65%. The left ventricle has normal function. The left ventricle has no  regional wall motion abnormalities. The left ventricular internal cavity  size was normal in size. There is   no left ventricular hypertrophy. Left ventricular diastolic parameters  were normal. Normal left ventricular filling pressure.   Right Ventricle: The right ventricular size is normal. No increase in  right ventricular wall thickness. Right ventricular systolic function is  normal. There is normal pulmonary artery systolic pressure. The tricuspid  regurgitant velocity is 2.22 m/s, and   with an assumed right atrial pressure of 3 mmHg, the estimated right  ventricular systolic pressure is 22.7 mmHg.   Left Atrium: Left atrial size was normal in size.  Right Atrium: Right atrial size was normal in size.   Pericardium: There is no evidence of pericardial effusion.   Mitral Valve: The mitral valve is normal in structure. No evidence of  mitral valve regurgitation. No evidence of mitral valve stenosis.   Tricuspid Valve: The tricuspid valve is normal in structure. Tricuspid  valve regurgitation is trivial. No evidence of tricuspid stenosis.   Aortic Valve: The aortic valve is tricuspid. Aortic valve regurgitation is  not visualized. No aortic stenosis is present.   Pulmonic Valve: The pulmonic valve was normal in structure. Pulmonic valve  regurgitation is not visualized. No evidence of pulmonic stenosis.   Aorta: The aortic root, ascending aorta and aortic arch are all  structurally normal, with no evidence of dilitation or obstruction.   Venous: A normal flow pattern is recorded from the right  upper pulmonary  vein. The inferior vena cava is normal in size with greater than 50%  respiratory variability, suggesting right atrial pressure of 3 mmHg.   IAS/Shunts: No atrial level shunt detected by color flow Doppler.    ZIO monitor Patch Wear Time:  13 days and 16 hours starting Apr 20, 2021. Indication: Palpitations   Patient had a min HR of 54 bpm, max HR of 240 bpm, and avg HR of 82 bpm. Predominant underlying rhythm was Sinus Rhythm.   1 run of Ventricular Tachycardia occurred lasting 5 beats with a maximum rate of 190 bpm (average 183 bpm).   16 Supraventricular Tachycardia runs occurred, the run with the fastest interval lasting 4 beats with a maximum rate of 240 bpm, the longest lasting 5 beats with an average rate of 104 bpm.   Premature atrial complexes were rare. Premature ventricular complexes were rare.   Symptoms were associated with sinus rhythm, cardiac and rare premature atrial complex.   No atrial fibrillation, and no pauses.   Conclusion- This study is remarkable for the following:                                  1.  Nonsustained ventricular tachycardia                                   2.  Paroxysmal supraventricular tachycardia which is likely atrial tachycardia.   Labs/Other Tests and Data Reviewed:    EKG:  No ECG reviewed.  Recent Labs: 05/18/2021: BUN 12; Creatinine, Ser 0.65; Magnesium 1.9; Potassium 4.3; Sodium 138   Recent Lipid Panel Lab Results  Component Value Date/Time   CHOL 204 (H) 07/08/2021 10:17 AM   TRIG 107 07/08/2021 10:17 AM   HDL 54 07/08/2021 10:17 AM   CHOLHDL 3.8 07/08/2021 10:17 AM   LDLCALC 131 (H) 07/08/2021 10:17 AM    Wt Readings from Last 3 Encounters:  07/13/21 212 lb (96.2 kg)  06/25/21 214 lb 12.8 oz (97.4 kg)  06/09/21 214 lb (97.1 kg)     Objective:    Vital Signs:  BP (!) 116/58   Pulse 90   Ht 5\' 6"  (1.676 m)   Wt 212 lb (96.2 kg)   SpO2 97%   BMI 34.22 kg/m    No physical exam performed  virtual visit  ASSESSMENT & PLAN:    Nonsustained ventricular tachycardia, Paroxysmal atrial tachycardia Obesity PVCs Hyperlipidemia Bradycardia  There is concern from the patient as her heart rate is dropping  into the 40s on the Cardizem 120 mg daily.  Although this has improved her symptoms.  What we will do stop the long-acting Cardizem and started patient on 30 mg of short acting Cardizem when she is going to start taking 30 mg at nighttime and eventually go up to every 8 hours as can tolerate.  If she does not tolerate this medication we will consider adding a low-dose flecainide and cutting back on her calcium channel blocker.  She is on her Crestor.  She needs a repeat lipid profile.  She is hoping that if her cholesterol comes down she will like to get off the Crestor for a while and use diet modification.  COVID-19 Education: The signs and symptoms of COVID-19 were discussed with the patient and how to seek care for testing (follow up with PCP or arrange E-visit).  The importance of social distancing was discussed today.  Time:   Today, I have spent 10 minutes with the patient with telehealth technology discussing the above problems.     Medication Adjustments/Labs and Tests Ordered: Current medicines are reviewed at length with the patient today.  Concerns regarding medicines are outlined above.   Tests Ordered: No orders of the defined types were placed in this encounter.   Medication Changes: No orders of the defined types were placed in this encounter.   Follow Up:  Virtual Visit  in 3 month(s)  Signed, Thomasene Ripple, DO  07/13/2021 9:42 AM    [facility]

## 2021-07-22 ENCOUNTER — Ambulatory Visit: Payer: BC Managed Care – PPO | Admitting: Cardiology

## 2021-08-03 ENCOUNTER — Other Ambulatory Visit: Payer: Self-pay | Admitting: Legal Medicine

## 2021-08-03 DIAGNOSIS — F900 Attention-deficit hyperactivity disorder, predominantly inattentive type: Secondary | ICD-10-CM

## 2021-08-04 MED ORDER — AMPHETAMINE-DEXTROAMPHETAMINE 15 MG PO TABS: 15.0000 mg | ORAL_TABLET | Freq: Every day | ORAL | 0 refills | Status: DC

## 2021-08-13 ENCOUNTER — Ambulatory Visit: Payer: BC Managed Care – PPO | Admitting: Legal Medicine

## 2021-08-17 NOTE — Progress Notes (Deleted)
NEUROLOGY FOLLOW UP OFFICE NOTE  Jeanette Alvarado [id number]  Assessment/Plan:   Migraine without aura, without status migrainosus, not intractable  Migraine prevention:  *** Migraine rescue:  *** Limit use of pain relievers to no more than 2 days out of week to prevent risk of rebound or medication-overuse headache. Keep headache diary Follow up ***   Subjective:  Jeanette Alvarado is a 50 year old right-handed female with ADHD and depression who follows up for migraines.   UPDATE: Insurance denied Vyepti until she has tried all three injectables.  Started [employer] ***  Frequency of abortive medication: Take Excedrin every 2 to 3 days for the last 6 weeks prior to next infusion.  Current NSAIDS:  none Current analgesics:  Excedrin Migraine Current triptans:  none Current ergotamine:  none Current anti-emetic:  Zofran ODT 4mg  Current muscle relaxants:  none Current anti-anxiolytic:  Clonazepam 0.5mg  PRN Current sleep aide:  none Current Antihypertensive medications:  none Current Antidepressant medications:  none Current Anticonvulsant medications: none  Current anti-CGRP:  Emgality Nurtec (rescue) Current Vitamins/Herbal/Supplements:  none Current Antihistamines/Decongestants:  none Other therapy:  Ice packs Hormone/birth control:  none Other medications:  Adderall   Caffeine:  2 to 3 cups of coffee a day, sometimes more.  No soda or energy drinks Diet:  6-7 16oz bottles of water daily.  Skips meals. Exercise:  no Depression:  Yes (due to pain); Anxiety:  Yes (due to current pain) Other pain:  no Sleep hygiene:  Poor (due to work.  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   HISTORY:  She started having migraines as a teenager.  They used to be pounding posterior bilateral that would respond to Excedrin, lasting a day or two at most, 1 to 2 times a month.  She was able to function. Typical triggers include  stress and allergies. She started having an intractable migraine for the past  3 months.  No preceding trigger such as head injury, illness, change in medication, or new emotional stress.  The headache is persistent but changes in location and intensity.  They are a throbbing or stabbing headache, unilateral either side, eye feels like it is going to pop out.  The severe attacks may last 3 to 4 days and occur every 1 to 2 days.  Sometimes she sees spots.  There is associated nausea, vomiting (may be due to topiramate), photophobia, phonophobia, osmophobia, eye lacrimation, but unsure if there is associated ptosis or conjunctival injection.  She cannot identify a trigger.  Ice pack and cool dark room and sleep may help.  She has been treating with Excedrin Migraine almost everyday.     MRI of brain with and without contrast and MRA of head and neck on 09/17/2020 were unremarkable.   Past NSAIDS:  Ibuprofen, naproxen Past analgesics:  Fioricet, Tylenol, tramadol Past abortive triptans:  Sumatriptan 100mg  Past abortive ergotamine:  none Past muscle relaxants:  none Past anti-emetic:  Zofran and Phenergan  Past antihypertensive medications:  none Past antidepressant medications:  Effexor Past anticonvulsant medications:  Depakote, topiramate (causes nausea and brain fog/word-finding difficulty) Past anti-CGRP:  Aimovig (several months, injection site reaction,caused myalgias, flu-like symptoms), Vyepti 100mg  (some benefit) Past vitamins/Herbal/Supplements:  none Past antihistamines/decongestants:  none Other past therapies:  Chiropractic medication (previously helpful but not for current intractable migraine)     Family history of headache:  Mom, sisters  PAST MEDICAL HISTORY: Past Medical History:  Diagnosis Date   ADHD    Aortic dilatation (HCC) 04/07/2021   Found on CT scan 2022   Ascending aortic aneurysm (HCC) 04/20/2021   BMI 36.0-36.9,adult 07/09/2020   Chest pain 04/08/2021   Family  history of premature CAD 04/20/2021   Fatigue 06/01/2021   GERD (gastroesophageal reflux disease) 04/08/2021   Major depressive disorder, recurrent, moderate (HCC)    Migraine without aura, not intractable, without status migrainosus    NSVT (nonsustained ventricular tachycardia) (HCC) 06/01/2021   Palpitations 04/20/2021   PAT (paroxysmal atrial tachycardia) (HCC) 06/01/2021   PVC's (premature ventricular contractions) 04/20/2021   Shortness of breath 06/01/2021   Shoulder pain, left 01/20/2021   Tobacco use 04/20/2021    MEDICATIONS: Current Outpatient Medications on File Prior to Visit  Medication Sig Dispense Refill   amphetamine-dextroamphetamine (ADDERALL) 15 MG tablet Take 1 tablet by mouth daily with breakfast. Time for visit 30 tablet 0   clonazePAM (KLONOPIN) 0.5 MG tablet Take 1 tablet (0.5 mg total) by mouth 2 (two) times daily. (Patient taking differently: Take 0.5 mg by mouth as needed for anxiety.) 60 tablet 3   diltiazem (CARDIZEM) 30 MG tablet Take 1 tablet (30 mg total) by mouth every 8 (eight) hours. 270 tablet 3   ondansetron (ZOFRAN ODT) 4 MG disintegrating tablet Take 1 tablet (4 mg total) by mouth every 8 (eight) hours as needed for nausea or vomiting. 20 tablet 5   rosuvastatin (CRESTOR) 5 MG tablet Take 1 tablet (5 mg total) by mouth daily. 90 tablet 3   SUMAtriptan (IMITREX) 100 MG tablet Take 1 tablet (100 mg total) by mouth every 2 (two) hours as needed for migraine. May repeat in 2 hours if headache persists or recurs. 10 tablet 5   Vitamin D, Ergocalciferol, (DRISDOL) 1.25 MG (50000 UNIT) CAPS capsule Take 1 capsule (50,000 Units total) by mouth every 7 (seven) days. 12 capsule 0   No current facility-administered medications on file prior to visit.    ALLERGIES: Allergies  Allergen Reactions   Penicillins Anaphylaxis and Other (See Comments)    Unknown   Aspirin Nausea And Vomiting   Aimovig [Erenumab-Aooe]    Pyridium [Phenazopyridine] Rash    FAMILY  HISTORY: Family History  Problem Relation Age of Onset   Breast cancer Mother    Hypothyroidism Mother    Hypertension Father    Hyperlipidemia Father    CVA Father       Objective:  *** General: No acute distress.  Patient appears ***-groomed.   Head:  Normocephalic/atraumatic Eyes:  Fundi examined but not visualized Neck: supple, no paraspinal tenderness, full range of motion Heart:  Regular rate and rhythm Lungs:  Clear to auscultation bilaterally Back: No paraspinal tenderness Neurological Exam: alert and oriented to person, place, and time.  Speech fluent and not dysarthric, language intact.  CN II-XII intact. Bulk and tone normal, muscle strength 5/5 throughout.  Sensation to light touch intact.  Deep tendon reflexes 2+ throughout, toes downgoing.  Finger to nose testing intact.  Gait normal, Romberg negative.   Shon Millet, DO  CC: ***

## 2021-08-19 ENCOUNTER — Ambulatory Visit: Payer: BC Managed Care – PPO | Admitting: Neurology

## 2021-08-27 ENCOUNTER — Other Ambulatory Visit: Payer: Self-pay | Admitting: Cardiology

## 2021-08-27 ENCOUNTER — Ambulatory Visit: Payer: BC Managed Care – PPO | Admitting: Cardiology

## 2022-03-31 ENCOUNTER — Encounter: Payer: Self-pay | Admitting: Family Medicine

## 2022-03-31 ENCOUNTER — Ambulatory Visit: Payer: BC Managed Care – PPO | Admitting: Family Medicine

## 2022-03-31 VITALS — BP 114/68 | HR 72 | Temp 97.2°F | Resp 18 | Ht 66.0 in | Wt 228.4 lb

## 2022-03-31 DIAGNOSIS — Z6836 Body mass index (BMI) 36.0-36.9, adult: Secondary | ICD-10-CM

## 2022-03-31 DIAGNOSIS — R21 Rash and other nonspecific skin eruption: Secondary | ICD-10-CM

## 2022-03-31 DIAGNOSIS — K219 Gastro-esophageal reflux disease without esophagitis: Secondary | ICD-10-CM | POA: Diagnosis not present

## 2022-03-31 DIAGNOSIS — E782 Mixed hyperlipidemia: Secondary | ICD-10-CM | POA: Diagnosis not present

## 2022-03-31 DIAGNOSIS — N951 Menopausal and female climacteric states: Secondary | ICD-10-CM | POA: Diagnosis not present

## 2022-03-31 DIAGNOSIS — I471 Supraventricular tachycardia: Secondary | ICD-10-CM

## 2022-03-31 MED ORDER — WEGOVY 0.5 MG/0.5ML ~~LOC~~ SOAJ: 0.5000 mg | SUBCUTANEOUS | 0 refills | Status: DC

## 2022-03-31 MED ORDER — WEGOVY 0.25 MG/0.5ML ~~LOC~~ SOAJ: 0.2500 mg | SUBCUTANEOUS | 0 refills | Status: DC

## 2022-03-31 NOTE — Progress Notes (Signed)
? ?Subjective:  ?Patient ID: Jeanette Alvarado, female    DOB: [birth year]  Age: 51 y.o. MRN: [id number] ? ?Chief Complaint  ?Patient presents with  ? Rash  ?  Right foot  ?  ? Obesity  ? ? ?HPI ?Patient is working very hard to lose weight. She is working out 45 minutes 5 days a week and meeting with a trainer. She is watching her portion size. Patient has been doing this for 9 months. Meal prep. Eating 3-5 times per day. Patient feels like she is building muscle. Work out: aerobic 45 minutes per day and lifts weights. Patient discontinued all medicines in early January.  ? ?ADD: adderall once daily did not help so she stopped.   ?Hyperlipidemia: Stopped crestor. ?PSVT: Has occasional increase in heart rate but when she was taking diltiazem her heart drop was too low.  ?Rash on foot started about 2 weeks ago  ? ?Hot flashes worsened 3 months ago. Patient has vaginal hysterectomy, but one ovary left.  ?Quit smoking!  ? ?Current Outpatient Medications on File Prior to Visit  ?Medication Sig Dispense Refill  ? SUMAtriptan (IMITREX) 100 MG tablet Take 1 tablet (100 mg total) by mouth every 2 (two) hours as needed for migraine. May repeat in 2 hours if headache persists or recurs. 10 tablet 5  ? ?No current facility-administered medications on file prior to visit.  ? ?Past Medical History:  ?Diagnosis Date  ? ADHD   ? 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 Found on CT scan 2022  ? Ascending aortic aneurysm (HCC) 04/20/2021  ? BMI 36.0-36.9,adult 07/09/2020  ? Chest pain 04/08/2021  ? Family history of premature CAD 04/20/2021  ? Fatigue 06/01/2021  ? GERD (gastroesophageal reflux disease) 04/08/2021  ? Major depressive disorder, recurrent, moderate (HCC)   ? Migraine without aura, not intractable, without status migrainosus   ? NSVT (nonsustained ventricular tachycardia) (HCC) 06/01/2021  ? Palpitations 04/20/2021  ? PAT (paroxysmal atrial tachycardia) (HCC) 06/01/2021  ? PVC's (premature ventricular contractions) 04/20/2021  ?  Shortness of breath 06/01/2021  ? Shoulder pain, left 01/20/2021  ? Tobacco use 04/20/2021  ? ?Past Surgical History:  ?Procedure Laterality Date  ? exploratory laparotomy multiple times for endometriosis    ? KNEE ARTHROSCOPY Right   ? TONSILLECTOMY    ? VAGINAL HYSTERECTOMY    ?  ?Family History  ?Problem Relation Age of Onset  ? Breast cancer Mother   ? Hypothyroidism Mother   ? Hypertension Father   ? Hyperlipidemia Father   ? CVA Father   ? ?Social History  ? ?Socioeconomic History  ? Marital status: Divorced  ?  Spouse name: Not on file  ? Number of children: Not on file  ? Years of education: Not on file  ? Highest education level: Not on file  ?Occupational History  ? Not on file  ?Tobacco Use  ? Smoking status: Former  ?  Years: 33.00  ?  Types: Cigarettes  ?  Quit date: 12/10/2021  ?  Years since quitting: 0.3  ? Smokeless tobacco: Never  ?Vaping Use  ? Vaping Use: Never used  ?Substance and Sexual Activity  ? Alcohol use: Yes  ?  Alcohol/week: 1.0 standard drink  ?  Types: 1 Standard drinks or equivalent per week  ?  Comment: rarely  ? Drug use: Not Currently  ? Sexual activity: Not Currently  ?Other Topics Concern  ? Not on file  ?Social History Narrative  ? Right handed  ? Lives  in one story home with husband  ? ?Social Determinants of Health  ? ?Financial Resource Strain: Not on file  ?Food Insecurity: Not on file  ?Transportation Needs: Not on file  ?Physical Activity: Not on file  ?Stress: Not on file  ?Social Connections: Not on file  ? ? ?Review of Systems  ?Constitutional:  Negative for chills, fatigue and fever.  ?HENT:  Negative for congestion, rhinorrhea and sore throat.   ?Respiratory:  Negative for cough and shortness of breath.   ?Cardiovascular:  Positive for chest pain (Past due to see cardiology) and palpitations (much improved (past due to see Dr. Servando Salinaobb)).  ?Gastrointestinal:  Negative for abdominal pain, constipation, diarrhea, nausea and vomiting.  ?Genitourinary:  Negative for dysuria and  urgency.  ?Musculoskeletal:  Negative for back pain and myalgias.  ?Skin:  Positive for rash (right foot).  ?Neurological:  Negative for dizziness, weakness, light-headedness and headaches.  ?Psychiatric/Behavioral:  Negative for dysphoric mood. The patient is not nervous/anxious.   ? ? ?Objective:  ?BP 114/68   Pulse 72   Temp (!) 97.2 ?F (36.2 ?C)   Resp 18   Ht 5\' 6"  (1.676 m)   Wt 228 lb 6.4 oz (103.6 kg)   BMI 36.86 kg/m?  ? ? ?  03/31/2022  ?  9:51 AM 07/13/2021  ?  9:36 AM 06/25/2021  ?  3:08 PM  ?BP/Weight  ?Systolic BP 114 116 116  ?Diastolic BP 68 58 58  ?Wt. (Lbs) 228.4 212 214.8  ?BMI 36.86 kg/m2 34.22 kg/m2 34.67 kg/m2  ? ? ?Physical Exam ?Vitals reviewed.  ?Constitutional:   ?   Appearance: Normal appearance. She is normal weight.  ?Neck:  ?   Vascular: No carotid bruit.  ?Cardiovascular:  ?   Rate and Rhythm: Normal rate and regular rhythm.  ?   Heart sounds: Normal heart sounds.  ?Pulmonary:  ?   Effort: Pulmonary effort is normal. No respiratory distress.  ?   Breath sounds: Normal breath sounds.  ?Abdominal:  ?   General: Abdomen is flat. Bowel sounds are normal.  ?   Palpations: Abdomen is soft.  ?   Tenderness: There is no abdominal tenderness.  ?Skin: ?   Findings: Rash (rt foot: mild erythematous macules with dry skin improving.) present.  ?Neurological:  ?   Mental Status: She is alert and oriented to person, place, and time.  ?Psychiatric:     ?   Mood and Affect: Mood normal.     ?   Behavior: Behavior normal.  ? ? ?Diabetic Foot Exam - Simple   ?No data filed ?  ?  ? ?Lab Results  ?Component Value Date  ? WBC 6.1 03/31/2022  ? HGB 14.7 03/31/2022  ? HCT 42.2 03/31/2022  ? PLT 363 03/31/2022  ? GLUCOSE 93 03/31/2022  ? CHOL 218 (H) 03/31/2022  ? TRIG 103 03/31/2022  ? HDL 58 03/31/2022  ? LDLCALC 142 (H) 03/31/2022  ? ALT 21 03/31/2022  ? AST 21 03/31/2022  ? NA 140 03/31/2022  ? K 4.7 03/31/2022  ? CL 106 03/31/2022  ? CREATININE 0.79 03/31/2022  ? BUN 14 03/31/2022  ? CO2 21 03/31/2022   ? TSH 1.020 03/31/2022  ? ? ? ? ?Assessment & Plan:  ? ?Problem List Items Addressed This Visit   ? ?  ? Cardiovascular and Mediastinum  ? Paroxysmal SVT (supraventricular tachycardia) (HCC)  ?  Continue to avoid caffeine.  If worsens will consider adding medication or referring back to cardiology. ? ?  ?  ?  ?  Digestive  ? GERD (gastroesophageal reflux disease)  ?  Continue to work on weight loss.  Currently not on medications. ? ?  ?  ?  ? Musculoskeletal and Integument  ? Rash of foot  ?  Nearly resolved. Call if treatment needed.  ? ?  ?  ?  ? Other  ? Hyperlipidemia - Primary  ?  Uncontrolled.  Started on Wegovy.   ?Hopefully weight loss will help. ?Continue to work on eating a healthy diet and exercise.  ?Labs drawn today.  ? ?  ?  ? Relevant Orders  ? Comprehensive metabolic panel (Completed)  ? Lipid panel (Completed)  ? VITAMIN D 25 Hydroxy (Vit-D Deficiency, Fractures) (Completed)  ? Menopause syndrome  ?  Labs checked.  Prefers not to go on HRT. ?Started on Effexor XR 37.5 mg every morning ? ?  ?  ? Relevant Orders  ? FSH/LH (Completed)  ? DHEA-sulfate (Completed)  ? Testosterone,Free and Total (Completed)  ? Estradiol (Completed)  ? TSH (Completed)  ? Progesterone (Completed)  ? Cortisol (Completed)  ? VITAMIN D 25 Hydroxy (Vit-D Deficiency, Fractures) (Completed)  ? Severe obesity with body mass index (BMI) of 36.0 to 36.9 with serious comorbidity (HCC)  ?  Start wegovy.  ?Keep up good work with diet and exercise.  ?Comorbidities include hyperlipidemia and GERD. ? ?  ?  ? Relevant Medications  ? Semaglutide-Weight Management (WEGOVY) 0.5 MG/0.5ML SOAJ  ? Semaglutide-Weight Management (WEGOVY) 0.25 MG/0.5ML SOAJ  ? Other Relevant Orders  ? CBC with Differential/Platelet (Completed)  ? Comprehensive metabolic panel (Completed)  ? Lipid panel (Completed)  ?. ? ?Meds ordered this encounter  ?Medications  ? Semaglutide-Weight Management (WEGOVY) 0.5 MG/0.5ML SOAJ  ?  Sig: Inject 0.5 mg into the skin once a  week.  ?  Dispense:  2 mL  ?  Refill:  0  ? Semaglutide-Weight Management (WEGOVY) 0.25 MG/0.5ML SOAJ  ?  Sig: Inject 0.25 mg into the skin once a week.  ?  Dispense:  2 mL  ?  Refill:  0  ? ? ?Orders Placed This En

## 2022-03-31 NOTE — Patient Instructions (Signed)
Consider effexor xr 37.5 mg once daily to help with Menopause. Await labs/testing for assessment and recommendations. ? ?Start on wegovy 0.25 mg once weekly x 4 weeks, then increase to 0.5 mg weekly.  ? ?

## 2022-04-01 LAB — CBC WITH DIFFERENTIAL/PLATELET
Basophils Absolute: 0 10*3/uL (ref 0.0–0.2)
Basos: 1 %
EOS (ABSOLUTE): 0.2 10*3/uL (ref 0.0–0.4)
Eos: 3 %
Hematocrit: 42.2 % (ref 34.0–46.6)
Hemoglobin: 14.7 g/dL (ref 11.1–15.9)
Immature Grans (Abs): 0 10*3/uL (ref 0.0–0.1)
Immature Granulocytes: 0 %
Lymphocytes Absolute: 2.1 10*3/uL (ref 0.7–3.1)
Lymphs: 34 %
MCH: 31.5 pg (ref 26.6–33.0)
MCHC: 34.8 g/dL (ref 31.5–35.7)
MCV: 91 fL (ref 79–97)
Monocytes Absolute: 0.4 10*3/uL (ref 0.1–0.9)
Monocytes: 6 %
Neutrophils Absolute: 3.5 10*3/uL (ref 1.4–7.0)
Neutrophils: 56 %
Platelets: 363 10*3/uL (ref 150–450)
RBC: 4.66 x10E6/uL (ref 3.77–5.28)
RDW: 12.6 % (ref 11.7–15.4)
WBC: 6.1 10*3/uL (ref 3.4–10.8)

## 2022-04-01 LAB — FSH/LH
FSH: 71.4 m[IU]/mL
LH: 50.5 m[IU]/mL

## 2022-04-01 LAB — LIPID PANEL
Chol/HDL Ratio: 3.8 ratio (ref 0.0–4.4)
Cholesterol, Total: 218 mg/dL — ABNORMAL HIGH (ref 100–199)
HDL: 58 mg/dL (ref >39)
LDL Chol Calc (NIH): 142 mg/dL — ABNORMAL HIGH (ref 0–99)
Triglycerides: 103 mg/dL (ref 0–149)
VLDL Cholesterol Cal: 18 mg/dL (ref 5–40)

## 2022-04-01 LAB — ESTRADIOL: Estradiol: 28.1 pg/mL

## 2022-04-01 LAB — DHEA-SULFATE: DHEA-SO4: 121 ug/dL (ref 41.2–243.7)

## 2022-04-01 LAB — COMPREHENSIVE METABOLIC PANEL
ALT: 21 IU/L (ref 0–32)
AST: 21 IU/L (ref 0–40)
Albumin/Globulin Ratio: 1.9 (ref 1.2–2.2)
Albumin: 4.6 g/dL (ref 3.8–4.8)
Alkaline Phosphatase: 61 IU/L (ref 44–121)
BUN/Creatinine Ratio: 18 (ref 9–23)
BUN: 14 mg/dL (ref 6–24)
Bilirubin Total: 0.3 mg/dL (ref 0.0–1.2)
CO2: 21 mmol/L (ref 20–29)
Calcium: 9.7 mg/dL (ref 8.7–10.2)
Chloride: 106 mmol/L (ref 96–106)
Creatinine, Ser: 0.79 mg/dL (ref 0.57–1.00)
Globulin, Total: 2.4 g/dL (ref 1.5–4.5)
Glucose: 93 mg/dL (ref 70–99)
Potassium: 4.7 mmol/L (ref 3.5–5.2)
Sodium: 140 mmol/L (ref 134–144)
Total Protein: 7 g/dL (ref 6.0–8.5)
eGFR: 92 mL/min/{1.73_m2} (ref >59)

## 2022-04-01 LAB — VITAMIN D 25 HYDROXY (VIT D DEFICIENCY, FRACTURES): Vit D, 25-Hydroxy: 31.6 ng/mL (ref 30.0–100.0)

## 2022-04-01 LAB — TESTOSTERONE,FREE AND TOTAL
Testosterone, Free: 1.6 pg/mL (ref 0.0–4.2)
Testosterone: 22 ng/dL (ref 4–50)

## 2022-04-01 LAB — PROGESTERONE: Progesterone: 0.2 ng/mL

## 2022-04-01 LAB — TSH: TSH: 1.02 u[IU]/mL (ref 0.450–4.500)

## 2022-04-01 LAB — CORTISOL: Cortisol: 5.9 ug/dL — ABNORMAL LOW (ref 6.2–19.4)

## 2022-04-04 NOTE — Progress Notes (Signed)
Rest of labs normal. ?Hormonal labs are consistent with menopause.  Vitamin D was low normal. ?Cholesterol: LDL is very high at 142.  Goal is less than 100.  Patient was started on wegovy. Hopefully this will help with weight loss and help LDL.  Recheck fasting in 3 months. ?

## 2022-04-05 DIAGNOSIS — E66812 Obesity, class 2: Secondary | ICD-10-CM | POA: Insufficient documentation

## 2022-04-05 DIAGNOSIS — N951 Menopausal and female climacteric states: Secondary | ICD-10-CM | POA: Insufficient documentation

## 2022-04-05 DIAGNOSIS — R21 Rash and other nonspecific skin eruption: Secondary | ICD-10-CM | POA: Insufficient documentation

## 2022-04-05 HISTORY — DX: Rash and other nonspecific skin eruption: R21

## 2022-04-05 NOTE — Assessment & Plan Note (Signed)
Nearly resolved. Call if treatment needed.  ?

## 2022-04-05 NOTE — Assessment & Plan Note (Addendum)
Start wegovy.  ?Keep up good work with diet and exercise.  ?Comorbidities include hyperlipidemia and GERD. ?

## 2022-04-05 NOTE — Assessment & Plan Note (Signed)
Uncontrolled.  Started on Wegovy.   ?Hopefully weight loss will help. ?Continue to work on eating a healthy diet and exercise.  ?Labs drawn today.  ? ?

## 2022-04-05 NOTE — Assessment & Plan Note (Signed)
Continue to work on weight loss.  Currently not on medications. ?

## 2022-04-05 NOTE — Assessment & Plan Note (Addendum)
Continue to avoid caffeine.  If worsens will consider adding medication or referring back to cardiology. ?

## 2022-04-05 NOTE — Assessment & Plan Note (Signed)
Labs checked.  Prefers not to go on HRT. ?Started on Effexor XR 37.5 mg every morning ?

## 2022-04-06 ENCOUNTER — Other Ambulatory Visit: Payer: Self-pay

## 2022-04-06 MED ORDER — VENLAFAXINE HCL ER 37.5 MG PO CP24: 37.5000 mg | ORAL_CAPSULE | Freq: Every day | ORAL | 1 refills | Status: DC

## 2022-05-10 DIAGNOSIS — S63602A Unspecified sprain of left thumb, initial encounter: Secondary | ICD-10-CM | POA: Diagnosis not present

## 2022-05-10 DIAGNOSIS — S63502A Unspecified sprain of left wrist, initial encounter: Secondary | ICD-10-CM | POA: Diagnosis not present

## 2022-05-11 DIAGNOSIS — S63642A Sprain of metacarpophalangeal joint of left thumb, initial encounter: Secondary | ICD-10-CM | POA: Diagnosis not present

## 2022-05-12 DIAGNOSIS — Z043 Encounter for examination and observation following other accident: Secondary | ICD-10-CM | POA: Diagnosis not present

## 2022-05-12 DIAGNOSIS — S63642A Sprain of metacarpophalangeal joint of left thumb, initial encounter: Secondary | ICD-10-CM | POA: Diagnosis not present

## 2022-05-16 DIAGNOSIS — S60012A Contusion of left thumb without damage to nail, initial encounter: Secondary | ICD-10-CM | POA: Diagnosis not present

## 2022-05-30 DIAGNOSIS — S60222A Contusion of left hand, initial encounter: Secondary | ICD-10-CM | POA: Diagnosis not present

## 2022-11-02 ENCOUNTER — Encounter: Payer: Self-pay | Admitting: Cardiology

## 2022-11-02 ENCOUNTER — Ambulatory Visit: Payer: BC Managed Care – PPO | Attending: Cardiology | Admitting: Cardiology

## 2022-11-02 VITALS — BP 106/82 | HR 98 | Ht 66.0 in | Wt 241.0 lb

## 2022-11-02 DIAGNOSIS — R002 Palpitations: Secondary | ICD-10-CM

## 2022-11-02 MED ORDER — PROPRANOLOL HCL 10 MG PO TABS: 10.0000 mg | ORAL_TABLET | Freq: Two times a day (BID) | ORAL | 3 refills | Status: DC

## 2022-11-02 NOTE — Patient Instructions (Signed)
Medication Instructions:  Your physician has recommended you make the following change in your medication:  START: Propanolol 10mg  twice daily  *If you need a refill on your cardiac medications before your next appointment, please call your pharmacy*   Lab Work: NONE If you have labs (blood work) drawn today and your tests are completely normal, you will receive your results only by: MyChart Message (if you have MyChart) OR A paper copy in the mail If you have any lab test that is abnormal or we need to change your treatment, we will call you to review the results.   Testing/Procedures: NONE   Follow-Up: At [facility], you and your health needs are our priority.  As part of our continuing mission to provide you with exceptional heart care, we have created designated Provider Care Teams.  These Care Teams include your primary Cardiologist (physician) and Advanced Practice Providers (APPs -  Physician Assistants and Nurse Practitioners) who all work together to provide you with the care you need, when you need it.  We recommend signing up for the patient portal called "MyChart".  Sign up information is provided on this After Visit Summary.  MyChart is used to connect with patients for Virtual Visits (Telemedicine).  Patients are able to view lab/test results, encounter notes, upcoming appointments, etc.  Non-urgent messages can be sent to your provider as well.   To learn more about what you can do with MyChart, go to [facility].    Your next appointment:   6 month(s)  The format for your next appointment:   In Person  Provider:   [email or web address], DO

## 2022-11-02 NOTE — Progress Notes (Signed)
Cardiology Office Note:    Date:  11/04/2022   ID:  Jeanette Alvarado, DOB Feb 19, 1971, MRN [id number]  PCP:  Blane Oharaox, Kirsten, MD  Cardiologist:  Thomasene RippleKardie Brookelle Pellicane, DO  Electrophysiologist:  None   Referring MD: Blane Oharaox, Kirsten, MD   " I am having chest pain"  History of Present Illness:    Jeanette Alvarado is a 51 y.o. female with a hx of  obesity, depression, premature family history of coronary artery disease is here today for follow-up visit.  At her initial visit the patient was experiencing palpitation chest pain I sent her for coronary CTA unfortunately her heart rate did not allow us to get this testing done.  She had a monitor which she wore show evidence of nonsustained ventricular tachycardia and paroxysmal atrial tachycardia.  She was started on Toprol-XL but did not tolerate this medication and has since been transitioned to Cardizem 120 mg daily. She was unable to tolerate the Cardizem and that medication was taken out due to low blood pressure.  She is here today for follow-up visit.  Tells me that she has been having some issues with palpitations.  She is concerned  Past Medical History:  Diagnosis Date   ADHD    Aortic dilatation (HCC) 04/07/2021   Found on CT scan 2022   Ascending aortic aneurysm (HCC) 04/20/2021   BMI 36.0-36.9,adult 07/09/2020   Chest pain 04/08/2021   Family history of premature CAD 04/20/2021   Fatigue 06/01/2021   GERD (gastroesophageal reflux disease) 04/08/2021   Major depressive disorder, recurrent, moderate (HCC)    Migraine without aura, not intractable, without status migrainosus    NSVT (nonsustained ventricular tachycardia) (HCC) 06/01/2021   Palpitations 04/20/2021   PAT (paroxysmal atrial tachycardia) 06/01/2021   PVC's (premature ventricular contractions) 04/20/2021   Shortness of breath 06/01/2021   Shoulder pain, left 01/20/2021   Tobacco use 04/20/2021    Past Surgical History:  Procedure Laterality Date   exploratory laparotomy multiple times for  endometriosis     KNEE ARTHROSCOPY Right    TONSILLECTOMY     VAGINAL HYSTERECTOMY      Current Medications: Current Meds  Medication Sig   propranolol (INDERAL) 10 MG tablet Take 1 tablet (10 mg total) by mouth 2 (two) times daily.   SUMAtriptan (IMITREX) 100 MG tablet Take 1 tablet (100 mg total) by mouth every 2 (two) hours as needed for migraine. May repeat in 2 hours if headache persists or recurs.     Allergies:   Penicillins, Aspirin, Aimovig [erenumab-aooe], and Pyridium [phenazopyridine]   Social History   Socioeconomic History   Marital status: Divorced    Spouse name: Not on file   Number of children: Not on file   Years of education: Not on file   Highest education level: Not on file  Occupational History   Not on file  Tobacco Use   Smoking status: Former    Years: 33.00    Types: Cigarettes    Quit date: 12/10/2021    Years since quitting: 0.9   Smokeless tobacco: Never  Vaping Use   Vaping Use: Never used  Substance and Sexual Activity   Alcohol use: Yes    Alcohol/week: 1.0 standard drink of alcohol    Types: 1 Standard drinks or equivalent per week    Comment: rarely   Drug use: Not Currently   Sexual activity: Not Currently  Other Topics Concern   Not on file  Social History Narrative   Right handed  Lives in one story home with husband   Social Determinants of Health   Financial Resource Strain: Not on file  Food Insecurity: Not on file  Transportation Needs: Not on file  Physical Activity: Not on file  Stress: Not on file  Social Connections: Not on file     Family History: The patient's family history includes Breast cancer in her mother; CVA in her father; Hyperlipidemia in her father; Hypertension in her father; Hypothyroidism in her mother.  ROS:   Review of Systems  Constitution: Negative for decreased appetite, fever and weight gain.  HENT: Negative for congestion, ear discharge, hoarse voice and sore throat.   Eyes: Negative  for discharge, redness, vision loss in right eye and visual halos.  Cardiovascular: Negative for chest pain, dyspnea on exertion, leg swelling, orthopnea and palpitations.  Respiratory: Negative for cough, hemoptysis, shortness of breath and snoring.   Endocrine: Negative for heat intolerance and polyphagia.  Hematologic/Lymphatic: Negative for bleeding problem. Does not bruise/bleed easily.  Skin: Negative for flushing, nail changes, rash and suspicious lesions.  Musculoskeletal: Negative for arthritis, joint pain, muscle cramps, myalgias, neck pain and stiffness.  Gastrointestinal: Negative for abdominal pain, bowel incontinence, diarrhea and excessive appetite.  Genitourinary: Negative for decreased libido, genital sores and incomplete emptying.  Neurological: Negative for brief paralysis, focal weakness, headaches and loss of balance.  Psychiatric/Behavioral: Negative for altered mental status, depression and suicidal ideas.  Allergic/Immunologic: Negative for HIV exposure and persistent infections.    EKGs/Labs/Other Studies Reviewed:    The following studies were reviewed today:   EKG:  The ekg ordered today demonstrates sinus rhythm  Recent Labs: 03/31/2022: ALT 21; BUN 14; Creatinine, Ser 0.79; Hemoglobin 14.7; Platelets 363; Potassium 4.7; Sodium 140; TSH 1.020  Recent Lipid Panel    Component Value Date/Time   CHOL 218 (H) 03/31/2022 1445   TRIG 103 03/31/2022 1445   HDL 58 03/31/2022 1445   CHOLHDL 3.8 03/31/2022 1445   LDLCALC 142 (H) 03/31/2022 1445    Physical Exam:    VS:  BP 106/82   Pulse 98   Ht 5\' 6"  (1.676 m)   Wt 241 lb (109.3 kg)   SpO2 94%   BMI 38.90 kg/m     Wt Readings from Last 3 Encounters:  11/02/22 241 lb (109.3 kg)  03/31/22 228 lb 6.4 oz (103.6 kg)  07/13/21 212 lb (96.2 kg)     GEN: Well nourished, well developed in no acute distress HEENT: Normal NECK: No JVD; No carotid bruits LYMPHATICS: No lymphadenopathy CARDIAC: S1S2  noted,RRR, no murmurs, rubs, gallops RESPIRATORY:  Clear to auscultation without rales, wheezing or rhonchi  ABDOMEN: Soft, non-tender, non-distended, +bowel sounds, no guarding. EXTREMITIES: No edema, No cyanosis, no clubbing MUSCULOSKELETAL:  No deformity  SKIN: Warm and dry NEUROLOGIC:  Alert and oriented x 3, non-focal PSYCHIATRIC:  Normal affect, good insight  ASSESSMENT:    1. Palpitations    PLAN:    We will like to try again with another AV nodal blockade given the fact that she is very symptomatic when she feels the palpitations.  Start propanolol 10 mg twice daily.  The patient is in agreement with the above plan. The patient left the office in stable condition.  The patient will follow up in 6 months or sooner if needed.   Medication Adjustments/Labs and Tests Ordered: Current medicines are reviewed at length with the patient today.  Concerns regarding medicines are outlined above.  Orders Placed This Encounter  Procedures  EKG 12-Lead   Meds ordered this encounter  Medications   propranolol (INDERAL) 10 MG tablet    Sig: Take 1 tablet (10 mg total) by mouth 2 (two) times daily.    Dispense:  180 tablet    Refill:  3    Patient Instructions  Medication Instructions:  Your physician has recommended you make the following change in your medication:  START: Propanolol 10mg  twice daily  *If you need a refill on your cardiac medications before your next appointment, please call your pharmacy*   Lab Work: NONE If you have labs (blood work) drawn today and your tests are completely normal, you will receive your results only by: MyChart Message (if you have MyChart) OR A paper copy in the mail If you have any lab test that is abnormal or we need to change your treatment, we will call you to review the results.   Testing/Procedures: NONE   Follow-Up: At [facility], you and your health needs are our priority.  As part of our continuing mission to  provide you with exceptional heart care, we have created designated Provider Care Teams.  These Care Teams include your primary Cardiologist (physician) and Advanced Practice Providers (APPs -  Physician Assistants and Nurse Practitioners) who all work together to provide you with the care you need, when you need it.  We recommend signing up for the patient portal called "MyChart".  Sign up information is provided on this After Visit Summary.  MyChart is used to connect with patients for Virtual Visits (Telemedicine).  Patients are able to view lab/test results, encounter notes, upcoming appointments, etc.  Non-urgent messages can be sent to your provider as well.   To learn more about what you can do with MyChart, go to [facility].    Your next appointment:   6 month(s)  The format for your next appointment:   In Person  Provider:   [email or web address], DO     Adopting a Healthy Lifestyle.  Know what a healthy weight is for you (roughly BMI <25) and aim to maintain this   Aim for 7+ servings of fruits and vegetables daily   65-80+ fluid ounces of water or unsweet tea for healthy kidneys   Limit to max 1 drink of alcohol per day; avoid smoking/tobacco   Limit animal fats in diet for cholesterol and heart health - choose grass fed whenever available   Avoid highly processed foods, and foods high in saturated/trans fats   Aim for low stress - take time to unwind and care for your mental health   Aim for 150 min of moderate intensity exercise weekly for heart health, and weights twice weekly for bone health   Aim for 7-9 hours of sleep daily   When it comes to diets, agreement about the perfect plan isnt easy to find, even among the experts. Experts at the [facility] developed an idea known as the Healthy Eating Plate. Just imagine a plate divided into logical, healthy portions.   The emphasis is on diet quality:   Load up on vegetables and fruits -  one-half of your plate: Aim for color and variety, and remember that potatoes dont count.   Go for whole grains - one-quarter of your plate: Whole wheat, barley, wheat berries, quinoa, oats, brown rice, and foods made with them. If you want pasta, go with whole wheat pasta.   Protein power - one-quarter of your plate: Fish, chicken, beans, and nuts are  all healthy, versatile protein sources. Limit red meat.   The diet, however, does go beyond the plate, offering a few other suggestions.   Use healthy plant oils, such as olive, canola, soy, corn, sunflower and peanut. Check the labels, and avoid partially hydrogenated oil, which have unhealthy trans fats.   If youre thirsty, drink water. Coffee and tea are good in moderation, but skip sugary drinks and limit milk and dairy products to one or two daily servings.   The type of carbohydrate in the diet is more important than the amount. Some sources of carbohydrates, such as vegetables, fruits, whole grains, and beans-are healthier than others.   Finally, stay active  Signed, Thomasene Ripple, DO  11/04/2022 12:37 PM     [facility]

## 2023-02-08 ENCOUNTER — Telehealth: Payer: Self-pay | Admitting: Cardiology

## 2023-02-08 DIAGNOSIS — R079 Chest pain, unspecified: Secondary | ICD-10-CM | POA: Diagnosis not present

## 2023-02-08 NOTE — Telephone Encounter (Signed)
Pt c/o of Chest Pain: STAT if CP now or developed within 24 hours  1. Are you having CP right now?  Yes   2. Are you experiencing any other symptoms (ex. SOB, nausea, vomiting, sweating)?  Chest tightness   3. How long have you been experiencing CP?  Past 2-3 days   4. Is your CP continuous or coming and going?  Coming   5. Have you taken Nitroglycerin?  States she does not have any ?

## 2023-02-08 NOTE — Telephone Encounter (Signed)
Received stat call from patient who reports chest pain/tightness over the last few days. Patient reports the pain radiates to her left shoulder, and states that this pain is worse with exertion. Patient reports that her heart has been racing as well. Patient reports that as soon as she gets up and moves around or goes up a flight of stairs she has this chest pain that radiates and reports this at present. Advised patient to report to the ER for evaluation. Patient aware of recommendations and verbalized understanding. Will forward to MD to make aware.

## 2023-02-20 NOTE — Progress Notes (Unsigned)
Acute Office Visit  Subjective:    Patient ID: Jeanette Alvarado, female    DOB: 20-Jul-1971, 52 y.o.   MRN: [id number]  Chief Complaints: rash on neck  History of Present ilIness: Patient is in today for rash on her neck RASH  Duration:  {Blank single:19197::"chronic","days","weeks","months"}  Location: {Blank multiple:19196::"generalized","trunk","face","hands","arms","legs","groin"}  Itching: {Blank single:19197::"yes","no"} Burning: {Blank single:19197::"yes","no"} Redness: {Blank single:19197::"yes","no"} Oozing: {Blank single:19197::"yes","no"} Scaling: {Blank single:19197::"yes","no"} Blisters: {Blank single:19197::"yes","no"} Painful: {Blank single:19197::"yes","no"} Fevers: {Blank single:19197::"yes","no"} Change in detergents/soaps/personal care products: {Blank single:19197::"yes","no"} Recent illness: {Blank single:19197::"yes","no"} Recent travel:{Blank single:19197::"yes","no"} History of same: {Blank single:19197::"yes","no"} Context: {Blank multiple:19196::"better","worse","stable","fluctuating","contacts with the same"} Alleviating factors: {Blank multiple:19196::"hydrocortisone cream","benadryl","lotion/moisturizer","nothing"} Treatments attempted:{Blank multiple:19196::"hydrocortisone cream","benadryl","OTC anit-fungal","lotion/moisturizer","nothing"} Shortness of breath: {Blank single:19197::"yes","no"}  Throat/tongue swelling: {Blank single:19197::"yes","no"} Myalgias/arthralgias: {Blank single:19197::"yes","no"}   Past Medical History:  Diagnosis Date   ADHD    Aortic dilatation ([place]) 04/07/2021   Found on CT scan 2022   Ascending aortic aneurysm ([place]) 04/20/2021   BMI 36.0-36.9,adult 07/09/2020   Chest pain 04/08/2021   Family history of premature CAD 04/20/2021   Fatigue 06/01/2021   GERD (gastroesophageal reflux disease) 04/08/2021   Major depressive disorder, recurrent, moderate (HCC)    Migraine without aura, not intractable, without status migrainosus     NSVT (nonsustained ventricular tachycardia) ([place]) 06/01/2021   Palpitations 04/20/2021   PAT (paroxysmal atrial tachycardia) 06/01/2021   PVC's (premature ventricular contractions) 04/20/2021   Shortness of breath 06/01/2021   Shoulder pain, left 01/20/2021   Tobacco use 04/20/2021    Past Surgical History:  Procedure Laterality Date   exploratory laparotomy multiple times for endometriosis     KNEE ARTHROSCOPY Right    TONSILLECTOMY     VAGINAL HYSTERECTOMY      Family History  Problem Relation Age of Onset   Breast cancer Mother    Hypothyroidism Mother    Hypertension Father    Hyperlipidemia Father    CVA Father     Social History   Socioeconomic History   Marital status: Divorced    Spouse name: Not on file   Number of children: Not on file   Years of education: Not on file   Highest education level: Not on file  Occupational History   Not on file  Tobacco Use   Smoking status: Former    Years: 33.00    Types: Cigarettes    Quit date: 12/10/2021    Years since quitting: 1.1   Smokeless tobacco: Never  Vaping Use   Vaping Use: Never used  Substance and Sexual Activity   Alcohol use: Yes    Alcohol/week: 1.0 standard drink of alcohol    Types: 1 Standard drinks or equivalent per week    Comment: rarely   Drug use: Not Currently   Sexual activity: Not Currently  Other Topics Concern   Not on file  Social History Narrative   Right handed   Lives in one story home with husband   Social Determinants of Health   Financial Resource Strain: Not on file  Food Insecurity: Not on file  Transportation Needs: Not on file  Physical Activity: Not on file  Stress: Not on file  Social Connections: Not on file  Intimate Partner Violence: Not on file    Outpatient Medications Prior to Visit  Medication Sig Dispense Refill   propranolol (INDERAL) 10 MG tablet Take 1 tablet (10 mg total) by mouth 2 (two) times daily. 180 tablet 3   SUMAtriptan (IMITREX) 100 MG tablet  Take 1 tablet (100 mg total) by  mouth every 2 (two) hours as needed for migraine. May repeat in 2 hours if headache persists or recurs. 10 tablet 5   No facility-administered medications prior to visit.    Allergies  Allergen Reactions   Penicillins Anaphylaxis and Other (See Comments)    Unknown   Aspirin Nausea And Vomiting   Aimovig [Erenumab-Aooe]    Pyridium [Phenazopyridine] Rash    Review of Systems  See pertinent positives and negatives per HPI.      Objective:    Physical Exam Vitals reviewed.  Constitutional:      Appearance: Normal appearance.  Neck:     Vascular: No carotid bruit.  Cardiovascular:     Rate and Rhythm: Normal rate and regular rhythm.     Heart sounds: Normal heart sounds.  Pulmonary:     Effort: Pulmonary effort is normal.     Breath sounds: Normal breath sounds.  Abdominal:     General: Bowel sounds are normal.     Palpations: Abdomen is soft.     Tenderness: There is no abdominal tenderness.  Skin:    Findings: Rash present.  Neurological:     Mental Status: She is alert and oriented to person, place, and time.  Psychiatric:        Mood and Affect: Mood normal.        Behavior: Behavior normal.     There were no vitals taken for this visit. Wt Readings from Last 3 Encounters:  11/02/22 241 lb (109.3 kg)  03/31/22 228 lb 6.4 oz (103.6 kg)  07/13/21 212 lb (96.2 kg)    Health Maintenance Due  Topic Date Due   HIV Screening  Never done   Hepatitis C Screening  Never done   DTaP/Tdap/Td (1 - Tdap) Never done   PAP SMEAR-Modifier  Never done   COLONOSCOPY (Pts 45-56yr Insurance coverage will need to be confirmed)  Never done   MAMMOGRAM  Never done   Zoster Vaccines- Shingrix (1 of 2) Never done   INFLUENZA VACCINE  Never done   COVID-19 Vaccine (2 - 2023-24 season) 08/12/2022    There are no preventive care reminders to display for this patient.   Lab Results  Component Value Date   TSH 1.020 03/31/2022   Lab Results   Component Value Date   WBC 6.1 03/31/2022   HGB 14.7 03/31/2022   HCT 42.2 03/31/2022   MCV 91 03/31/2022   PLT 363 03/31/2022   Lab Results  Component Value Date   NA 140 03/31/2022   K 4.7 03/31/2022   CO2 21 03/31/2022   GLUCOSE 93 03/31/2022   BUN 14 03/31/2022   CREATININE 0.79 03/31/2022   BILITOT 0.3 03/31/2022   ALKPHOS 61 03/31/2022   AST 21 03/31/2022   ALT 21 03/31/2022   PROT 7.0 03/31/2022   ALBUMIN 4.6 03/31/2022   CALCIUM 9.7 03/31/2022   EGFR 92 03/31/2022   Lab Results  Component Value Date   CHOL 218 (H) 03/31/2022   Lab Results  Component Value Date   HDL 58 03/31/2022   Lab Results  Component Value Date   LDLCALC 142 (H) 03/31/2022   Lab Results  Component Value Date   TRIG 103 03/31/2022   Lab Results  Component Value Date   CHOLHDL 3.8 03/31/2022   No results found for: "HGBA1C"     Assessment & Plan:   There are no diagnoses linked to this encounter.    Follow-up: No follow-ups on file.  An After  Visit Summary was printed and given to the patient.  Neil Crouch, DNP, [place] [phone]

## 2023-02-21 ENCOUNTER — Ambulatory Visit: Payer: BC Managed Care – PPO | Admitting: Nurse Practitioner

## 2023-02-21 ENCOUNTER — Encounter: Payer: Self-pay | Admitting: Nurse Practitioner

## 2023-02-21 VITALS — BP 110/60 | HR 80 | Temp 97.4°F | Resp 16 | Ht 66.0 in | Wt 241.0 lb

## 2023-02-21 DIAGNOSIS — L739 Follicular disorder, unspecified: Secondary | ICD-10-CM | POA: Insufficient documentation

## 2023-02-21 MED ORDER — DOXYCYCLINE HYCLATE 100 MG PO TABS: 100.0000 mg | ORAL_TABLET | Freq: Two times a day (BID) | ORAL | 0 refills | Status: AC

## 2023-02-21 NOTE — Patient Instructions (Signed)
Folliculitis  Folliculitis occurs when hair follicles become inflamed. A hair follicle is a tiny opening in your skin where your hair grows from. This condition often occurs on the scalp, thighs, legs, back, and buttocks but can happen anywhere on the body. What are the causes? A common cause of this condition is an infection from bacteria. The type of folliculitis caused by bacteria can last a long time or go away and come back. The bacteria can live anywhere on your skin. They are often found in the nostrils. Other causes may include: An infection from a fungus. An infection from a virus. Your skin touching some chemicals, such as oils and tars. Shaving or waxing. Greasy ointments or creams put on the skin. What increases the risk? You are more likely to develop this condition if: Your body has a weak disease-fighting system (immune system). You have diabetes. You are obese. What are the signs or symptoms? Symptoms of this condition include: Redness. Soreness. Swelling. Itching. Small white or yellow, itchy spots filled with pus (pustules) that appear over a red area. If the infection goes deep into the follicle, these may turn into a boil (furuncle). A group of boils (carbuncle). These tend to form in hairy, sweaty areas of the body. How is this diagnosed? This condition is diagnosed with a skin exam. Your health care provider may take a sample of one of the pustules or boils to test in a lab. How is this treated? This condition may be treated by: Putting a warm, wet cloth (warm compress) on the affected areas. Taking antibiotics or applying them to the skin. Applying or bathing with a solution that kills germs (antiseptic). Taking an over-the-counter medicine. This can help with itching. Having a procedure to drain pustules or boils. This may be done if a pustule or boil contains a lot of pus or fluid. Having laser hair removal. This may be done when the condition lasts for a  long time. Follow these instructions at home: Managing pain and swelling  If directed, apply heat to the affected area as often as told by your health care provider. Use the heat source that your health care provider recommends, such as a moist heat pack or a heating pad. Place a towel between your skin and the heat source. Leave the heat on for 20-30 minutes. If your skin turns bright red, remove the heat right away to prevent burns. The risk of burns is higher if you cannot feel pain, heat, or cold. General instructions Take over-the-counter and prescription medicines only as told by your health care provider. If you were prescribed antibiotics, take or apply them as told by your health care provider. Do not stop using the antibiotic even if you start to feel better. Check your irritated area every day for signs of infection. Check for: More redness, swelling, or pain. Fluid or blood. Warmth. Pus or a bad smell. Do not shave irritated skin. Keep all follow-up visits. Your health care provider will check if the treatments are helping. Contact a health care provider if: You have a fever. You have any signs of infection. Red streaks are spreading from the affected area. This information is not intended to replace advice given to you by your health care provider. Make sure you discuss any questions you have with your health care provider. Document Revised: 05/03/2022 Document Reviewed: 05/03/2022 Elsevier Patient Education  2023 Elsevier Inc.  

## 2023-02-21 NOTE — Assessment & Plan Note (Signed)
Start antibiotics Apply hydrocortisone as needed Change the soap to Dial or Dove some mild kind of soap Advised to put a warm, wet cloth (warm compress) on the affected areas.

## 2023-03-26 NOTE — Progress Notes (Unsigned)
Subjective:  Patient ID: Jeanette Alvarado, female    DOB: [birth year]  Age: 52 y.o. MRN: [id number]  Chief Complaint  Patient presents with   CPE    HPI Well Adult Physical: Patient here for a comprehensive physical exam.The patient reports no problems Do you take any herbs or supplements that were not prescribed by a doctor? no Are you taking calcium supplements? no Are you taking aspirin daily? no  Encounter for general adult medical examination without abnormal findings  Physical ("At Risk" items are starred): Patient's last physical exam was 1 year ago .  Patient is not afflicted from Stress Incontinence and Urge Incontinence  Patient wears a seat belts Patient has smoke detectors and has carbon monoxide detectors. Patient practices appropriate gun safety. Patient wears sunscreen with extended sun exposure. Dental Care: Dentures. brushes daily Ophthalmology/Optometry: Annual visit.  Hearing loss: none Vision impairments: Glasses  Menarche: 52 yo Menstrual History: S/P Hysterectomy LMP: 2006 Pregnancy history: 7 (2 live births) Safe at home: yes Self breast exams: yes     03/27/2023    9:12 AM 05/07/2021    8:13 AM 05/07/2021    8:00 AM 01/20/2021    9:40 AM 07/30/2020    1:01 PM  Depression screen PHQ 2/9  Decreased Interest 0 1 0 1 2  Down, Depressed, Hopeless 1 0 0 1 1  PHQ - 2 Score 1 1 0 2 3  Altered sleeping 3 0  0 3  Tired, decreased energy Change in appetite 0 0  0 2  Feeling bad or failure about yourself  1 0  0 0  Trouble concentrating Moving slowly or fidgety/restless 3 0  2 0  Suicidal thoughts 0 0  0 0  PHQ-9 Score Difficult doing work/chores Somewhat difficult Somewhat difficult  Somewhat difficult Somewhat difficult         07/09/2020    9:33 AM 08/27/2020    9:03 AM 02/24/2021    9:34 AM 03/27/2023    8:38 AM  Fall Risk  Falls in the past year? 0 0 0 0  Was there an injury with Fall? 0 0 0 0  Fall Risk Category  Calculator 0 0 0 0  Fall Risk Category (Retired) Low Low Low   (RETIRED) Patient Fall Risk Level Low fall risk Low fall risk Low fall risk   Patient at Risk for Falls Due to    No Fall Risks  Fall risk Follow up Falls evaluation completed   Falls evaluation completed             Social Hx   Social History   Socioeconomic History   Marital status: Divorced    Spouse name: Not on file   Number of children: Not on file   Years of education: Not on file   Highest education level: Associate degree: occupational, Scientist, product/process development, or vocational program  Occupational History   Not on file  Tobacco Use   Smoking status: Former    Packs/day: 1.50    Years: 33.00    Additional pack years: 0.00    Total pack years: 49.50    Types: Cigarettes    Quit date: 12/10/2021    Years since quitting: 1.3   Smokeless tobacco: Never  Vaping Use   Vaping Use: Never used  Substance and Sexual Activity   Alcohol use: Yes    Alcohol/week: 1.0 standard drink  of alcohol    Types: 1 Standard drinks or equivalent per week    Comment: rarely   Drug use: Not Currently   Sexual activity: Not Currently  Other Topics Concern   Not on file  Social History Narrative   Right handed   Lives in one story home with husband   Social Determinants of Health   Financial Resource Strain: Low Risk  (03/26/2023)   Overall Financial Resource Strain (CARDIA)    Difficulty of Paying Living Expenses: Not very hard  Food Insecurity: Food Insecurity Present (03/26/2023)   Hunger Vital Sign    Worried About Running Out of Food in the Last Year: Sometimes true    Ran Out of Food in the Last Year: Never true  Transportation Needs: No Transportation Needs (03/26/2023)   PRAPARE - Administrator, Civil Service (Medical): No    Lack of Transportation (Non-Medical): No  Physical Activity: Sufficiently Active (03/26/2023)   Exercise Vital Sign    Days of Exercise per Week: 4 days    Minutes of Exercise per Session:  90 min  Stress: Stress Concern Present (03/26/2023)   [employer] of Occupational Health - Occupational Stress Questionnaire    Feeling of Stress : Rather much  Social Connections: Socially Isolated (03/26/2023)   Social Connection and Isolation Panel [NHANES]    Frequency of Communication with Friends and Family: Three times a week    Frequency of Social Gatherings with Friends and Family: Once a week    Attends Religious Services: Never    Database administrator or Organizations: No    Attends Engineer, structural: Not on file    Marital Status: Divorced   Past Medical History:  Diagnosis Date   ADHD    Aortic dilatation 04/07/2021   Found on CT scan 2022   Ascending aortic aneurysm 04/20/2021   BMI 36.0-36.9,adult 07/09/2020   Chest pain 04/08/2021   Family history of premature CAD 04/20/2021   Fatigue 06/01/2021   GERD (gastroesophageal reflux disease) 04/08/2021   Major depressive disorder, recurrent, moderate    Migraine without aura, not intractable, without status migrainosus    NSVT (nonsustained ventricular tachycardia) 06/01/2021   Palpitations 04/20/2021   PAT (paroxysmal atrial tachycardia) 06/01/2021   PVC's (premature ventricular contractions) 04/20/2021   Shortness of breath 06/01/2021   Shoulder pain, left 01/20/2021   Tobacco use 04/20/2021   Past Surgical History:  Procedure Laterality Date   exploratory laparotomy multiple times for endometriosis     KNEE ARTHROSCOPY Right    TONSILLECTOMY     VAGINAL HYSTERECTOMY      Family History  Problem Relation Age of Onset   Breast cancer Mother    Hypothyroidism Mother    Hypertension Father    Hyperlipidemia Father    CVA Father     Review of Systems  Constitutional:  Positive for diaphoresis. Negative for chills, fatigue and fever.  HENT:  Negative for congestion, ear pain, rhinorrhea and sore throat.   Respiratory:  Negative for cough and shortness of breath.   Cardiovascular:  Negative for chest  pain.  Gastrointestinal:  Negative for abdominal pain, constipation, diarrhea, nausea and vomiting.  Genitourinary:  Negative for dysuria and urgency.  Musculoskeletal:  Positive for arthralgias and back pain. Negative for myalgias.  Neurological:  Positive for headaches. Negative for dizziness, weakness and light-headedness.  Psychiatric/Behavioral:  Negative for dysphoric mood. The patient is not nervous/anxious.      Objective:  BP 132/62  Pulse 86   Temp (!) 97.2 F (36.2 C)   Ht 5\' 6"  (1.676 m)   Wt 246 lb (111.6 kg)   SpO2 98%   BMI 39.71 kg/m      03/27/2023    8:35 AM 02/21/2023    2:35 PM 11/02/2022    9:46 AM  BP/Weight  Systolic BP 132 110 106  Diastolic BP 62 60 82  Wt. (Lbs) 246 241 241  BMI 39.71 kg/m2 38.9 kg/m2 38.9 kg/m2    Physical Exam Vitals reviewed.  Constitutional:      Appearance: Normal appearance.  HENT:     Right Ear: Tympanic membrane normal.     Left Ear: Tympanic membrane normal.     Nose: Nose normal.     Mouth/Throat:     Pharynx: No oropharyngeal exudate or posterior oropharyngeal erythema.  Eyes:     Conjunctiva/sclera: Conjunctivae normal.  Neck:     Vascular: No carotid bruit.  Cardiovascular:     Rate and Rhythm: Normal rate and regular rhythm.     Pulses: Normal pulses.     Heart sounds: Normal heart sounds.  Pulmonary:     Effort: Pulmonary effort is normal.     Breath sounds: Normal breath sounds.  Abdominal:     General: Bowel sounds are normal.     Palpations: There is no mass.     Tenderness: There is no abdominal tenderness.  Musculoskeletal:     Cervical back: Normal range of motion.  Lymphadenopathy:     Cervical: Cervical adenopathy present.     Right cervical: Superficial cervical adenopathy (anterior) present.     Left cervical: Superficial cervical adenopathy (anterior) present.  Skin:    Findings: No lesion.  Neurological:     Mental Status: She is alert and oriented to person, place, and time.   Psychiatric:        Mood and Affect: Mood normal.        Behavior: Behavior normal.     Lab Results  Component Value Date   WBC 6.6 03/27/2023   HGB 14.4 03/27/2023   HCT 42.8 03/27/2023   PLT 366 03/27/2023   GLUCOSE 90 03/27/2023   CHOL 201 (H) 03/27/2023   TRIG 112 03/27/2023   HDL 53 03/27/2023   LDLCALC 128 (H) 03/27/2023   ALT 15 03/27/2023   AST 18 03/27/2023   NA 138 03/27/2023   K 4.5 03/27/2023   CL 106 03/27/2023   CREATININE 0.76 03/27/2023   BUN 14 03/27/2023   CO2 18 (L) 03/27/2023   TSH 1.530 03/27/2023      Assessment & Plan:  Migraine without aura, not intractable, without status migrainosus Start topamax as directed.  Increase propranolol to 20 mg twice daily     ADHD Start strattera 40 mg once daily    Routine medical exam Things to do to keep yourself healthy  - Exercise at least 30-45 minutes a day, 3-4 days a week.  - Eat a low-fat diet with lots of fruits and vegetables, up to 7-9 servings per day.  - Seatbelts can save your life. Wear them always.  - Smoke detectors on every level of your home, check batteries every year.  - Eye Doctor - have an eye exam every 1-2 years  - Safe sex - if you may be exposed to STDs, use a condom.  - Alcohol -  If you drink, do it moderately, less than 2 drinks per day.  - Health Care Power of  Attorney. Choose someone to speak for you if you are not able.  - Depression is common in our stressful world.If you're feeling down or losing interest in things you normally enjoy, please come in for a visit.  - Violence - If anyone is threatening or hurting you, please call immediately.   Screening mammogram for breast cancer Ordered screening mammogram  Vitamin D deficiency disease The current medical regimen is effective;  continue present plan and medications.   Absence of both cervix and uterus, acquired Discontinued pap smear.  Paroxysmal SVT (supraventricular tachycardia) (HCC) Stable. Continue to  avoid caffeine.      Body mass index is 39.71 kg/m.   These are the goals we discussed:  Goals   None      This is a list of the screening recommended for you and due dates:  Health Maintenance  Topic Date Due   Screening for Lung Cancer  Never done   Mammogram  Never done   Zoster (Shingles) Vaccine (2 of 2) 05/24/2023   Flu Shot  07/13/2023   Colon Cancer Screening  08/06/2024   DTaP/Tdap/Td vaccine (2 - Td or Tdap) 06/23/2030   HPV Vaccine  Aged Out   COVID-19 Vaccine  Discontinued   Hepatitis C Screening: USPSTF Recommendation to screen - Ages 39-79 yo.  Discontinued   HIV Screening  Discontinued     Meds ordered this encounter  Medications   propranolol (INDERAL) 20 MG tablet    Sig: Take 0.5 tablets (10 mg total) by mouth 2 (two) times daily.    Dispense:  60 tablet    Refill:  2   atomoxetine (STRATTERA) 40 MG capsule    Sig: Take 1 capsule (40 mg total) by mouth daily.    Dispense:  30 capsule    Refill:  2   DISCONTD: topiramate (TOPAMAX) 50 MG tablet    Sig: Take 0.5 tablets (25 mg total) by mouth daily for 7 days, THEN 0.5 tablets (25 mg total) 2 (two) times daily for 7 days, THEN 1 tablet (50 mg total) 2 (two) times daily for 14 days.    Dispense:  39 tablet    Refill:  0    Follow-up: Return in about 3 months (around 06/26/2023) for cpe, chronic follow up.  An After Visit Summary was printed and given to the patient.  Clayborn Bigness I Leal-Borjas,acting as a scribe for Blane Ohara, MD.,have documented all relevant documentation on the behalf of Blane Ohara, MD,as directed by  Blane Ohara, MD while in the presence of Blane Ohara, MD.   Blane Ohara, MD [facility] [phone]

## 2023-03-27 ENCOUNTER — Other Ambulatory Visit: Payer: Self-pay | Admitting: Family Medicine

## 2023-03-27 ENCOUNTER — Ambulatory Visit (INDEPENDENT_AMBULATORY_CARE_PROVIDER_SITE_OTHER): Payer: BC Managed Care – PPO | Admitting: Family Medicine

## 2023-03-27 ENCOUNTER — Encounter: Payer: Self-pay | Admitting: Family Medicine

## 2023-03-27 VITALS — BP 132/62 | HR 86 | Temp 97.2°F | Ht 66.0 in | Wt 246.0 lb

## 2023-03-27 DIAGNOSIS — Z0001 Encounter for general adult medical examination with abnormal findings: Secondary | ICD-10-CM

## 2023-03-27 DIAGNOSIS — Z90710 Acquired absence of both cervix and uterus: Secondary | ICD-10-CM

## 2023-03-27 DIAGNOSIS — G43009 Migraine without aura, not intractable, without status migrainosus: Secondary | ICD-10-CM | POA: Diagnosis not present

## 2023-03-27 DIAGNOSIS — F900 Attention-deficit hyperactivity disorder, predominantly inattentive type: Secondary | ICD-10-CM | POA: Diagnosis not present

## 2023-03-27 DIAGNOSIS — I471 Supraventricular tachycardia, unspecified: Secondary | ICD-10-CM | POA: Diagnosis not present

## 2023-03-27 DIAGNOSIS — E559 Vitamin D deficiency, unspecified: Secondary | ICD-10-CM

## 2023-03-27 DIAGNOSIS — Z6839 Body mass index (BMI) 39.0-39.9, adult: Secondary | ICD-10-CM

## 2023-03-27 DIAGNOSIS — Z0000 Encounter for general adult medical examination without abnormal findings: Secondary | ICD-10-CM | POA: Diagnosis not present

## 2023-03-27 DIAGNOSIS — Z1231 Encounter for screening mammogram for malignant neoplasm of breast: Secondary | ICD-10-CM | POA: Diagnosis not present

## 2023-03-27 DIAGNOSIS — Z23 Encounter for immunization: Secondary | ICD-10-CM | POA: Diagnosis not present

## 2023-03-27 MED ORDER — ATOMOXETINE HCL 40 MG PO CAPS
40.0000 mg | ORAL_CAPSULE | Freq: Every day | ORAL | 2 refills | Status: DC
Start: 2023-03-27 — End: 2023-07-19

## 2023-03-27 MED ORDER — PROPRANOLOL HCL 20 MG PO TABS
10.0000 mg | ORAL_TABLET | Freq: Two times a day (BID) | ORAL | 2 refills | Status: AC
Start: 2023-03-27 — End: ?

## 2023-03-27 MED ORDER — TOPIRAMATE 50 MG PO TABS
ORAL_TABLET | ORAL | 0 refills | Status: DC
Start: 2023-03-27 — End: 2023-03-28

## 2023-03-27 NOTE — Patient Instructions (Addendum)
Things to do to keep yourself healthy  - Exercise at least 30-45 minutes a day, 3-4 days a week.  - Eat a low-fat diet with lots of fruits and vegetables, up to 7-9 servings per day.  - Seatbelts can save your life. Wear them always.  - Smoke detectors on every level of your home, check batteries every year.  - Eye Doctor - have an eye exam every 1-2 years  - Safe sex - if you may be exposed to STDs, use a condom.  - Alcohol -  If you drink, do it moderately, less than 2 drinks per day.  - Health Care Power of Attorney. Choose someone to speak for you if you are not able.  - Depression is common in our stressful world.If you're feeling down or losing interest in things you normally enjoy, please come in for a visit.  - Violence - If anyone is threatening or hurting you, please call immediately.    Check with insurance to see if they will cover a Low Contrast CT of lungs for lung cancer screening.  For migraines:  Start topamax as directed.  Increase propranolol to 20 mg twice daily   ADHD: Start strattera 40 mg once daily

## 2023-03-28 LAB — COMPREHENSIVE METABOLIC PANEL
ALT: 15 IU/L (ref 0–32)
AST: 18 IU/L (ref 0–40)
Albumin/Globulin Ratio: 1.5 (ref 1.2–2.2)
Albumin: 4.1 g/dL (ref 3.9–4.9)
Alkaline Phosphatase: 58 IU/L (ref 44–121)
BUN/Creatinine Ratio: 18 (ref 9–23)
BUN: 14 mg/dL (ref 6–24)
Bilirubin Total: <0.2 mg/dL (ref 0.0–1.2)
CO2: 18 mmol/L — ABNORMAL LOW (ref 20–29)
Calcium: 9.3 mg/dL (ref 8.7–10.2)
Chloride: 106 mmol/L (ref 96–106)
Creatinine, Ser: 0.76 mg/dL (ref 0.57–1.00)
Globulin, Total: 2.7 g/dL (ref 1.5–4.5)
Glucose: 90 mg/dL (ref 70–99)
Potassium: 4.5 mmol/L (ref 3.5–5.2)
Sodium: 138 mmol/L (ref 134–144)
Total Protein: 6.8 g/dL (ref 6.0–8.5)
eGFR: 95 mL/min/{1.73_m2} (ref >59)

## 2023-03-28 LAB — CBC WITH DIFFERENTIAL/PLATELET
Basophils Absolute: 0 10*3/uL (ref 0.0–0.2)
Basos: 1 %
EOS (ABSOLUTE): 0.1 10*3/uL (ref 0.0–0.4)
Eos: 2 %
Hematocrit: 42.8 % (ref 34.0–46.6)
Hemoglobin: 14.4 g/dL (ref 11.1–15.9)
Immature Grans (Abs): 0 10*3/uL (ref 0.0–0.1)
Immature Granulocytes: 0 %
Lymphocytes Absolute: 2.1 10*3/uL (ref 0.7–3.1)
Lymphs: 33 %
MCH: 30.6 pg (ref 26.6–33.0)
MCHC: 33.6 g/dL (ref 31.5–35.7)
MCV: 91 fL (ref 79–97)
Monocytes Absolute: 0.3 10*3/uL (ref 0.1–0.9)
Monocytes: 5 %
Neutrophils Absolute: 3.9 10*3/uL (ref 1.4–7.0)
Neutrophils: 59 %
Platelets: 366 10*3/uL (ref 150–450)
RBC: 4.7 x10E6/uL (ref 3.77–5.28)
RDW: 12.9 % (ref 11.7–15.4)
WBC: 6.6 10*3/uL (ref 3.4–10.8)

## 2023-03-28 LAB — TSH: TSH: 1.53 u[IU]/mL (ref 0.450–4.500)

## 2023-03-28 LAB — LIPID PANEL
Chol/HDL Ratio: 3.8 ratio (ref 0.0–4.4)
Cholesterol, Total: 201 mg/dL — ABNORMAL HIGH (ref 100–199)
HDL: 53 mg/dL (ref >39)
LDL Chol Calc (NIH): 128 mg/dL — ABNORMAL HIGH (ref 0–99)
Triglycerides: 112 mg/dL (ref 0–149)
VLDL Cholesterol Cal: 20 mg/dL (ref 5–40)

## 2023-03-28 LAB — VITAMIN D 25 HYDROXY (VIT D DEFICIENCY, FRACTURES): Vit D, 25-Hydroxy: 28.2 ng/mL — ABNORMAL LOW (ref 30.0–100.0)

## 2023-03-28 LAB — CARDIOVASCULAR RISK ASSESSMENT

## 2023-03-29 ENCOUNTER — Ambulatory Visit (INDEPENDENT_AMBULATORY_CARE_PROVIDER_SITE_OTHER): Payer: BC Managed Care – PPO

## 2023-03-29 ENCOUNTER — Encounter: Payer: Self-pay | Admitting: Family Medicine

## 2023-03-29 DIAGNOSIS — Z23 Encounter for immunization: Secondary | ICD-10-CM

## 2023-03-31 ENCOUNTER — Ambulatory Visit (INDEPENDENT_AMBULATORY_CARE_PROVIDER_SITE_OTHER): Payer: BC Managed Care – PPO

## 2023-03-31 ENCOUNTER — Ambulatory Visit: Payer: BC Managed Care – PPO | Admitting: Podiatry

## 2023-03-31 DIAGNOSIS — M722 Plantar fascial fibromatosis: Secondary | ICD-10-CM | POA: Diagnosis not present

## 2023-03-31 DIAGNOSIS — R52 Pain, unspecified: Secondary | ICD-10-CM | POA: Diagnosis not present

## 2023-03-31 MED ORDER — MELOXICAM 15 MG PO TABS
15.0000 mg | ORAL_TABLET | Freq: Every day | ORAL | 0 refills | Status: DC
Start: 2023-03-31 — End: 2023-05-30

## 2023-03-31 NOTE — Assessment & Plan Note (Signed)
Discontinued pap smear.

## 2023-03-31 NOTE — Assessment & Plan Note (Signed)

## 2023-03-31 NOTE — Progress Notes (Signed)
  Subjective:  Patient ID: Jeanette Alvarado, female    DOB: 06-Apr-1971,  MRN: [id number]  Chief Complaint  Patient presents with   Foot Pain    Pain located in the right foot located in the arch and radiates to the heel, patient is standing on concrete 13 hrs of the day,     52 y.o. female presents with the above complaint.  Presents with pain in the right heel.  Says it feels like a stabbing pain in the bottom of her right heel especially after she has been sitting or sleeping for a while.  Has tried different pairs of shoes but nothing seems to help.   Review of Systems: Negative except as noted in the HPI. Denies N/V/F/Ch.   Objective:  There were no vitals filed for this visit. There is no height or weight on file to calculate BMI. Constitutional Well developed. Well nourished.  Vascular Dorsalis pedis pulses palpable bilaterally. Posterior tibial pulses palpable bilaterally. Capillary refill normal to all digits.  No cyanosis or clubbing noted. Pedal hair growth normal.  Neurologic Normal speech. Oriented to person, place, and time. Epicritic sensation to light touch grossly present bilaterally.  Dermatologic Nails well groomed and normal in appearance. No open wounds. No skin lesions.  Orthopedic: Normal joint ROM without pain or crepitus bilaterally. No visible deformities. Tender to palpation at the calcaneal tuber right. No pain with calcaneal squeeze right. Ankle ROM diminished range of motion right. Silfverskiold Test: positive right.   Radiographs: Taken and reviewed. No acute fractures or dislocations. No evidence of stress fracture.  Plantar heel spur absent. Posterior heel spur absent.   Assessment:   1. Plantar fasciitis, right   2. Pain    Plan:  Patient was evaluated and treated and all questions answered.  Plantar Fasciitis, right - XR reviewed as above.  - Educated on icing and stretching. Instructions given.  - Injection delivered to the plantar  fascia as below. - DME: Dorsal orthotics dispensed - Pharmacologic management: Meloxicam 15 mg take this daily for the next 30 days. Educated on risks/benefits and proper taking of medication.  Procedure: Injection Tendon/Ligament Location: Right plantar fascia at the glabrous junction; medial approach. Skin Prep: alcohol Injectate: 1 cc 0.5% marcaine plain, 1 cc kenalog 10. Disposition: Patient tolerated procedure well. Injection site dressed with a band-aid.  Return in about 4 weeks (around 04/28/2023) for R PF.

## 2023-03-31 NOTE — Assessment & Plan Note (Signed)
Ordered screening mammogram.

## 2023-03-31 NOTE — Assessment & Plan Note (Signed)
Start strattera 40 mg once daily  Patient to let us know how it is working .

## 2023-03-31 NOTE — Assessment & Plan Note (Signed)
Start topamax as directed.  Increase propranolol to 20 mg twice daily

## 2023-03-31 NOTE — Assessment & Plan Note (Signed)
Stable. Continue to avoid caffeine.

## 2023-03-31 NOTE — Assessment & Plan Note (Signed)
The current medical regimen is effective;  continue present plan and medications.  

## 2023-04-05 ENCOUNTER — Ambulatory Visit: Payer: Self-pay | Admitting: Podiatry

## 2023-04-17 DIAGNOSIS — Z1231 Encounter for screening mammogram for malignant neoplasm of breast: Secondary | ICD-10-CM | POA: Diagnosis not present

## 2023-04-23 ENCOUNTER — Other Ambulatory Visit: Payer: Self-pay | Admitting: Family Medicine

## 2023-04-23 DIAGNOSIS — G43009 Migraine without aura, not intractable, without status migrainosus: Secondary | ICD-10-CM

## 2023-04-24 ENCOUNTER — Other Ambulatory Visit: Payer: Self-pay | Admitting: Family Medicine

## 2023-04-24 ENCOUNTER — Other Ambulatory Visit: Payer: Self-pay

## 2023-04-24 ENCOUNTER — Ambulatory Visit: Payer: BC Managed Care – PPO | Admitting: Cardiology

## 2023-04-24 DIAGNOSIS — Z1231 Encounter for screening mammogram for malignant neoplasm of breast: Secondary | ICD-10-CM

## 2023-04-24 DIAGNOSIS — R928 Other abnormal and inconclusive findings on diagnostic imaging of breast: Secondary | ICD-10-CM

## 2023-04-24 DIAGNOSIS — G43009 Migraine without aura, not intractable, without status migrainosus: Secondary | ICD-10-CM

## 2023-05-01 ENCOUNTER — Ambulatory Visit: Payer: BC Managed Care – PPO | Admitting: Podiatry

## 2023-05-01 DIAGNOSIS — M722 Plantar fascial fibromatosis: Secondary | ICD-10-CM | POA: Diagnosis not present

## 2023-05-01 NOTE — Progress Notes (Signed)
  Subjective:  Patient ID: Jeanette Alvarado, female    DOB: 1971/06/05,  MRN: [id number]  Chief Complaint  Patient presents with   Follow-up    Plantar fasciitis to right foot. Patient is doing great and the pain is about 90% better. Patient continues to used the inserts and taking mobic as needed.     52 y.o. female presents for follow-up on right heel plantar fasciitis.  She says the pain is is gone.  She is 90% improved.  She is using power step orthotics for her shoes which she thinks is helping her the most.  Also very satisfied with the ice water bottle technique for pain relief.   Review of Systems: Negative except as noted in the HPI. Denies N/V/F/Ch.   Objective:  There were no vitals filed for this visit. There is no height or weight on file to calculate BMI. Constitutional Well developed. Well nourished.  Vascular Dorsalis pedis pulses palpable bilaterally. Posterior tibial pulses palpable bilaterally. Capillary refill normal to all digits.  No cyanosis or clubbing noted. Pedal hair growth normal.  Neurologic Normal speech. Oriented to person, place, and time. Epicritic sensation to light touch grossly present bilaterally.  Dermatologic Nails well groomed and normal in appearance. No open wounds. No skin lesions.  Orthopedic: Normal joint ROM without pain or crepitus bilaterally. No visible deformities. No pain on palpation at the calcaneal tuber right. No pain with calcaneal squeeze right. Ankle ROM diminished range of motion right. Silfverskiold Test: positive right.   Radiographs: Taken and reviewed. No acute fractures or dislocations. No evidence of stress fracture.  Plantar heel spur absent. Posterior heel spur absent.   Assessment:   1. Plantar fasciitis, right     Plan:  Patient was evaluated and treated and all questions answered.  Plantar Fasciitis, right - XR reviewed as above.  - Educated on icing and stretching. Instructions given.  - Injection  deferred - DME: Continue power step orthotics. - Pharmacologic management: Ibuprofen as needed for pain   Return if symptoms worsen or fail to improve.

## 2023-05-02 ENCOUNTER — Telehealth: Payer: Self-pay

## 2023-05-02 NOTE — Telephone Encounter (Signed)
   Jeanette Alvarado has been scheduled for the following appointment:  WHAT: Diagnostic Mammo/Ultrasound (Left) WHERE: [facility]  DATE: 05/10/23 TIME: 9:50  Patient has been made aware.

## 2023-05-10 ENCOUNTER — Encounter: Payer: Self-pay | Admitting: Family Medicine

## 2023-05-10 DIAGNOSIS — R92322 Mammographic fibroglandular density, left breast: Secondary | ICD-10-CM | POA: Diagnosis not present

## 2023-05-10 DIAGNOSIS — N6012 Diffuse cystic mastopathy of left breast: Secondary | ICD-10-CM | POA: Diagnosis not present

## 2023-05-10 DIAGNOSIS — R928 Other abnormal and inconclusive findings on diagnostic imaging of breast: Secondary | ICD-10-CM | POA: Diagnosis not present

## 2023-05-10 LAB — HM MAMMOGRAPHY

## 2023-05-11 ENCOUNTER — Encounter: Payer: Self-pay | Admitting: Family Medicine

## 2023-05-14 DIAGNOSIS — F419 Anxiety disorder, unspecified: Secondary | ICD-10-CM | POA: Diagnosis not present

## 2023-05-14 DIAGNOSIS — M79602 Pain in left arm: Secondary | ICD-10-CM | POA: Diagnosis not present

## 2023-05-14 DIAGNOSIS — M5412 Radiculopathy, cervical region: Secondary | ICD-10-CM | POA: Diagnosis not present

## 2023-05-14 DIAGNOSIS — M25512 Pain in left shoulder: Secondary | ICD-10-CM | POA: Diagnosis not present

## 2023-05-15 DIAGNOSIS — H40003 Preglaucoma, unspecified, bilateral: Secondary | ICD-10-CM | POA: Diagnosis not present

## 2023-05-17 ENCOUNTER — Ambulatory Visit (INDEPENDENT_AMBULATORY_CARE_PROVIDER_SITE_OTHER): Payer: BC Managed Care – PPO | Admitting: Family Medicine

## 2023-05-17 VITALS — BP 110/70 | HR 78 | Temp 97.4°F | Resp 18 | Ht 66.0 in | Wt 251.0 lb

## 2023-05-17 DIAGNOSIS — M5412 Radiculopathy, cervical region: Secondary | ICD-10-CM

## 2023-05-17 DIAGNOSIS — M47812 Spondylosis without myelopathy or radiculopathy, cervical region: Secondary | ICD-10-CM | POA: Diagnosis not present

## 2023-05-17 MED ORDER — KETOROLAC TROMETHAMINE 60 MG/2ML IM SOLN
60.0000 mg | Freq: Once | INTRAMUSCULAR | Status: AC
Start: 2023-05-17 — End: 2023-05-17
  Administered 2023-05-17: 60 mg via INTRAMUSCULAR

## 2023-05-17 MED ORDER — GABAPENTIN (ONCE-DAILY) 300 MG PO TABS: 1.0000 | ORAL_TABLET | Freq: Three times a day (TID) | ORAL | 1 refills | Status: DC

## 2023-05-17 NOTE — Progress Notes (Unsigned)
ketorolac  Acute Office Visit  Subjective:    Patient ID: Jeanette Alvarado, female    DOB: 03/17/1971, 52 y.o.   MRN: [id number]  Chief Complaint  Patient presents with   Shoulder Pain    HPI: Patient is in today for ***  Past Medical History:  Diagnosis Date   ADHD    Aortic dilatation (HCC) 04/07/2021   Found on CT scan 2022   Ascending aortic aneurysm (HCC) 04/20/2021   BMI 36.0-36.9,adult 07/09/2020   Chest pain 04/08/2021   Family history of premature CAD 04/20/2021   Fatigue 06/01/2021   GERD (gastroesophageal reflux disease) 04/08/2021   Major depressive disorder, recurrent, moderate (HCC)    Migraine without aura, not intractable, without status migrainosus    NSVT (nonsustained ventricular tachycardia) (HCC) 06/01/2021   Palpitations 04/20/2021   PAT (paroxysmal atrial tachycardia) 06/01/2021   PVC's (premature ventricular contractions) 04/20/2021   Shortness of breath 06/01/2021   Shoulder pain, left 01/20/2021   Tobacco use 04/20/2021    Past Surgical History:  Procedure Laterality Date   exploratory laparotomy multiple times for endometriosis     KNEE ARTHROSCOPY Right    TONSILLECTOMY     VAGINAL HYSTERECTOMY      Family History  Problem Relation Age of Onset   Breast cancer Mother    Hypothyroidism Mother    Hypertension Father    Hyperlipidemia Father    CVA Father     Social History   Socioeconomic History   Marital status: Divorced    Spouse name: Not on file   Number of children: Not on file   Years of education: Not on file   Highest education level: Associate degree: occupational, Scientist, product/process development, or vocational program  Occupational History   Not on file  Tobacco Use   Smoking status: Former    Packs/day: 1.50    Years: 33.00    Additional pack years: 0.00    Total pack years: 49.50    Types: Cigarettes    Quit date: 12/10/2021    Years since quitting: 1.4   Smokeless tobacco: Never  Vaping Use   Vaping Use: Never used  Substance and Sexual  Activity   Alcohol use: Yes    Alcohol/week: 1.0 standard drink of alcohol    Types: 1 Standard drinks or equivalent per week    Comment: rarely   Drug use: Not Currently   Sexual activity: Not Currently  Other Topics Concern   Not on file  Social History Narrative   Right handed   Lives in one story home with husband   Social Determinants of Health   Financial Resource Strain: Low Risk  (03/26/2023)   Overall Financial Resource Strain (CARDIA)    Difficulty of Paying Living Expenses: Not very hard  Food Insecurity: Food Insecurity Present (03/26/2023)   Hunger Vital Sign    Worried About Running Out of Food in the Last Year: Sometimes true    Ran Out of Food in the Last Year: Never true  Transportation Needs: No Transportation Needs (03/26/2023)   PRAPARE - Administrator, Civil Service (Medical): No    Lack of Transportation (Non-Medical): No  Physical Activity: Sufficiently Active (03/26/2023)   Exercise Vital Sign    Days of Exercise per Week: 4 days    Minutes of Exercise per Session: 90 min  Stress: Stress Concern Present (03/26/2023)   [employer] of Occupational Health - Occupational Stress Questionnaire    Feeling of Stress : Rather much  Social Connections: Socially Isolated (03/26/2023)   Social Connection and Isolation Panel [NHANES]    Frequency of Communication with Friends and Family: Three times a week    Frequency of Social Gatherings with Friends and Family: Once a week    Attends Religious Services: Never    Database administrator or Organizations: No    Attends Engineer, structural: Not on file    Marital Status: Divorced  Intimate Partner Violence: Not At Risk (03/27/2023)   Humiliation, Afraid, Rape, and Kick questionnaire    Fear of Current or Ex-Partner: No    Emotionally Abused: No    Physically Abused: No    Sexually Abused: No    Outpatient Medications Prior to Visit  Medication Sig Dispense Refill   cyclobenzaprine  (FLEXERIL) 5 MG tablet Take 5 mg by mouth every 8 (eight) hours as needed.     gabapentin (NEURONTIN) 100 MG capsule Take 100-300 mg by mouth 3 (three) times daily as needed.     oxyCODONE (OXY IR/ROXICODONE) 5 MG immediate release tablet Take 5 mg by mouth every 6 (six) hours as needed.     predniSONE (DELTASONE) 20 MG tablet Take 40 mg by mouth daily.     atomoxetine (STRATTERA) 40 MG capsule Take 1 capsule (40 mg total) by mouth daily. 30 capsule 2   Cholecalciferol (VITAMIN D) 50 MCG (2000 UT) CAPS Take by mouth.     meloxicam (MOBIC) 15 MG tablet Take 1 tablet (15 mg total) by mouth daily. 30 tablet 0   propranolol (INDERAL) 20 MG tablet Take 0.5 tablets (10 mg total) by mouth 2 (two) times daily. 60 tablet 2   SUMAtriptan (IMITREX) 100 MG tablet Take 1 tablet (100 mg total) by mouth every 2 (two) hours as needed for migraine. May repeat in 2 hours if headache persists or recurs. 10 tablet 5   topiramate (TOPAMAX) 50 MG tablet Take 1 tablet (50 mg total) by mouth 2 (two) times daily. (Patient not taking: Reported on 05/17/2023) 60 tablet 2   No facility-administered medications prior to visit.    Allergies  Allergen Reactions   Penicillins Anaphylaxis and Other (See Comments)    Unknown   Aspirin Nausea And Vomiting   Aimovig [Erenumab-Aooe]    Pyridium [Phenazopyridine] Rash    Review of Systems     Objective:        05/17/2023   11:05 AM 03/27/2023    8:35 AM 02/21/2023    2:35 PM  Vitals with BMI  Height 5\' 6"  5\' 6"  5\' 6"   Weight 251 lbs 246 lbs 241 lbs  BMI 40.53 39.72 38.92  Systolic 110 132 161  Diastolic 70 62 60  Pulse 78 86 80    No data found.   Physical Exam  Health Maintenance Due  Topic Date Due   Lung Cancer Screening  Never done    There are no preventive care reminders to display for this patient.   Lab Results  Component Value Date   TSH 1.530 03/27/2023   Lab Results  Component Value Date   WBC 6.6 03/27/2023   HGB 14.4 03/27/2023   HCT  42.8 03/27/2023   MCV 91 03/27/2023   PLT 366 03/27/2023   Lab Results  Component Value Date   NA 138 03/27/2023   K 4.5 03/27/2023   CO2 18 (L) 03/27/2023   GLUCOSE 90 03/27/2023   BUN 14 03/27/2023   CREATININE 0.76 03/27/2023   BILITOT <0.2 03/27/2023  ALKPHOS 58 03/27/2023   AST 18 03/27/2023   ALT 15 03/27/2023   PROT 6.8 03/27/2023   ALBUMIN 4.1 03/27/2023   CALCIUM 9.3 03/27/2023   EGFR 95 03/27/2023   Lab Results  Component Value Date   CHOL 201 (H) 03/27/2023   Lab Results  Component Value Date   HDL 53 03/27/2023   Lab Results  Component Value Date   LDLCALC 128 (H) 03/27/2023   Lab Results  Component Value Date   TRIG 112 03/27/2023   Lab Results  Component Value Date   CHOLHDL 3.8 03/27/2023   No results found for: "HGBA1C"     Assessment & Plan:  There are no diagnoses linked to this encounter.   No orders of the defined types were placed in this encounter.   No orders of the defined types were placed in this encounter.    Follow-up: No follow-ups on file.  An After Visit Summary was printed and given to the patient.  Blane Ohara, MD [facility] [phone]

## 2023-05-18 ENCOUNTER — Other Ambulatory Visit: Payer: Self-pay | Admitting: Family Medicine

## 2023-05-18 ENCOUNTER — Encounter: Payer: Self-pay | Admitting: Family Medicine

## 2023-05-18 ENCOUNTER — Telehealth: Payer: Self-pay

## 2023-05-18 ENCOUNTER — Ambulatory Visit (HOSPITAL_COMMUNITY)
Admission: RE | Admit: 2023-05-18 | Discharge: 2023-05-18 | Disposition: A | Discharge status: Home / Self Care | Payer: BC Managed Care – PPO | Source: Ambulatory Visit | Attending: Family Medicine | Admitting: Family Medicine

## 2023-05-18 DIAGNOSIS — M5412 Radiculopathy, cervical region: Secondary | ICD-10-CM | POA: Diagnosis not present

## 2023-05-18 MED ORDER — PREDNISONE 20 MG PO TABS: 40.0000 mg | ORAL_TABLET | Freq: Every day | ORAL | 0 refills | Status: DC

## 2023-05-18 NOTE — Telephone Encounter (Signed)
Patient informed of abnormal c-spine xray per Dr. Sedalia Muta request. Patient agreed to MRI and verbalized understanding. Patient was notified 05/17/2023.

## 2023-05-18 NOTE — Assessment & Plan Note (Signed)
Ordered cspine xray: very abnormal. See scanned report.  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  I discussed with Dr. Marikay Alar and he is going to see her this coming week.  Patient is to continue on prednisone 20 mg 2 daily at least until seen by Dr. Yetta Barre next week.

## 2023-05-19 ENCOUNTER — Other Ambulatory Visit: Payer: Self-pay | Admitting: Family Medicine

## 2023-05-19 MED ORDER — CYCLOBENZAPRINE HCL 5 MG PO TABS: 5.0000 mg | ORAL_TABLET | Freq: Three times a day (TID) | ORAL | 0 refills | Status: DC | PRN

## 2023-05-22 ENCOUNTER — Other Ambulatory Visit (HOSPITAL_COMMUNITY): Payer: Self-pay | Admitting: Neurological Surgery

## 2023-05-22 DIAGNOSIS — G959 Disease of spinal cord, unspecified: Secondary | ICD-10-CM

## 2023-05-23 ENCOUNTER — Encounter (HOSPITAL_COMMUNITY): Payer: Self-pay

## 2023-05-23 ENCOUNTER — Other Ambulatory Visit: Payer: Self-pay | Admitting: Neurological Surgery

## 2023-05-23 ENCOUNTER — Ambulatory Visit (HOSPITAL_COMMUNITY)
Admission: RE | Admit: 2023-05-23 | Discharge: 2023-05-23 | Disposition: A | Discharge status: Home / Self Care | Payer: BC Managed Care – PPO | Source: Ambulatory Visit | Attending: Neurological Surgery | Admitting: Neurological Surgery

## 2023-05-23 DIAGNOSIS — G959 Disease of spinal cord, unspecified: Secondary | ICD-10-CM | POA: Diagnosis not present

## 2023-05-23 DIAGNOSIS — M542 Cervicalgia: Secondary | ICD-10-CM | POA: Diagnosis not present

## 2023-05-24 ENCOUNTER — Encounter (HOSPITAL_COMMUNITY): Payer: Self-pay

## 2023-05-24 NOTE — Progress Notes (Signed)
     Your surgery and Pre-Admission testing visit will be at [facility] located at [address], [place], [place] [address].  Please let all your doctors (i.e., Primary Care Physician, Cardiologist, Endocrinologist, Pulmonologist) know you are having surgery. You may need clearance for surgery. If you are on blood thinners, notify your surgeon and ask the doctor who prescribed them how long to hold them before surgery.  If you have had a heart test, such as an EKG, stress test, heart ultrasound, etc., or lab work performed outside of [facility], please bring copies of these tests to your Pre-Admission testing, if possible.  These departments may contact you before the day of surgery:  Pre-Service Center - insurance/ billing: [phone] Pharmacy- to review your medications: [phone] Pre-Admission Testing- to set an appointment for your visit: [phone]  (Often, these numbers show up as "SPAM" on your phone)  The Pre-Admission Testing (PAT) visit focuses on Anesthesia for your upcoming surgery.  You do NOT need to fast; take your medications as usual. Please arrive 30 minutes early to allow for parking and admitting.  The visit may last up to an hour. Bring a photo ID and medical insurance card. Reschedule if you are sick. ([phone]) and please, NO children under age 16 at the visit.  During the PAT visit:  We will review your medical and surgical history.   You will receive pre-operative instructions, including the time of arrival at the hospital and surgical start time.  We will review what medication(s) you can take on the day of surgery.  After speaking with the nurse, you will have blood drawn and, if needed, a chest x-ray and EKG.  Most lab results from your doctor are good for 30 days, Hemoglobin A1C is good for 60 days. If you cannot talk to the Pharmacy, bring your medications or a list of them to the PST visit.   Infection control for the [facility] requires: All fingernail and toenail products should be removed before the day of surgery.  (SNS, Acrylic, Gel, Polish, Stickers, Press on, and Poly gel nails.)   Parking information:  Address: [facility] - [address], [place], [place] [address]  Please look for signs for entrance A off of [address]. Free valet parking is available Monday-Friday 05:30am-06:00pm     

## 2023-05-25 NOTE — Pre-Procedure Instructions (Signed)
Surgical Instructions   Your procedure is scheduled on Monday, June 17th. Report to [facility] Main Entrance "A" at 2:35 P.M., then check in with the Admitting office. Any questions or running late day of surgery: call [phone]  Questions prior to your surgery date: call [phone], Monday-Friday, 8am-4pm. If you experience any cold or flu symptoms such as cough, fever, chills, shortness of breath, etc. between now and your scheduled surgery, please notify us at the above number.     Remember:  Do not eat after midnight the night before your surgery  You may drink clear liquids until 1:35 PM the day of your surgery.   Clear liquids allowed are: Water, Non-Citrus Juices (without pulp), Carbonated Beverages, Clear Tea, Black Coffee Only (NO MILK, CREAM OR POWDERED CREAMER of any kind), and Gatorade.    Take these medicines the morning of surgery with A SIP OF WATER  predniSONE (DELTASONE)  propranolol (INDERAL)     May take these medicines IF NEEDED: cyclobenzaprine (FLEXERIL)  gabapentin (NEURONTIN)  SUMAtriptan (IMITREX)    One week prior to surgery, STOP taking any Aspirin (unless otherwise instructed by your surgeon) Aleve, Naproxen, Ibuprofen, Motrin, Advil, Goody's, BC's, all herbal medications, fish oil, and non-prescription vitamins.                     Do NOT Smoke (Tobacco/Vaping) for 24 hours prior to your procedure.  If you use a CPAP at night, you may bring your mask/headgear for your overnight stay.   You will be asked to remove any contacts, glasses, piercing's, hearing aid's, dentures/partials prior to surgery. Please bring cases for these items if needed.    Patients discharged the day of surgery will not be allowed to drive home, and someone needs to stay with them for 24 hours.  SURGICAL WAITING ROOM VISITATION Patients may have no more than 2 support people in the waiting area - these visitors may rotate.   Pre-op nurse will coordinate an appropriate  time for 1 ADULT support person, who may not rotate, to accompany patient in pre-op.  Children under the age of 44 must have an adult with them who is not the patient and must remain in the main waiting area with an adult.  If the patient needs to stay at the hospital during part of their recovery, the visitor guidelines for inpatient rooms apply.  Please refer to the [facility] website for the visitor guidelines for any additional information.   If you received a COVID test during your pre-op visit  it is requested that you wear a mask when out in public, stay away from anyone that may not be feeling well and notify your surgeon if you develop symptoms. If you have been in contact with anyone that has tested positive in the last 10 days please notify you surgeon.      Pre-operative 5 CHG Bath Instructions   You can play a key role in reducing the risk of infection after surgery. Your skin needs to be as free of germs as possible. You can reduce the number of germs on your skin by washing with CHG (chlorhexidine gluconate) soap before surgery. CHG is an antiseptic soap that kills germs and continues to kill germs even after washing.   DO NOT use if you have an allergy to chlorhexidine/CHG or antibacterial soaps. If your skin becomes reddened or irritated, stop using the CHG and notify one of our RNs at [phone].   Please shower with  the CHG soap starting 4 days before surgery using the following schedule:     Please keep in mind the following:  DO NOT shave, including legs and underarms, starting the day of your first shower.   You may shave your face at any point before/day of surgery.  Place clean sheets on your bed the day you start using CHG soap. Use a clean washcloth (not used since being washed) for each shower. DO NOT sleep with pets once you start using the CHG.   CHG Shower Instructions:  If you choose to wash your hair and private area, wash first with your normal  shampoo/soap.  After you use shampoo/soap, rinse your hair and body thoroughly to remove shampoo/soap residue.  Turn the water OFF and apply about 3 tablespoons (45 ml) of CHG soap to a CLEAN washcloth.  Apply CHG soap ONLY FROM YOUR NECK DOWN TO YOUR TOES (washing for 3-5 minutes)  DO NOT use CHG soap on face, private areas, open wounds, or sores.  Pay special attention to the area where your surgery is being performed.  If you are having back surgery, having someone wash your back for you may be helpful. Wait 2 minutes after CHG soap is applied, then you may rinse off the CHG soap.  Pat dry with a clean towel  Put on clean clothes/pajamas   If you choose to wear lotion, please use ONLY the CHG-compatible lotions on the back of this paper.   Additional instructions for the day of surgery: DO NOT APPLY any lotions, deodorants, cologne, or perfumes.   Do not bring valuables to the hospital. [facility] is not responsible for any belongings/valuables. Do not wear nail polish, gel polish, artificial nails, or any other type of covering on natural nails (fingers and toes) Do not wear jewelry or makeup Put on clean/comfortable clothes.  Please brush your teeth.  Ask your nurse before applying any prescription medications to the skin.     CHG Compatible Lotions   Aveeno Moisturizing lotion  Cetaphil Moisturizing Cream  Cetaphil Moisturizing Lotion  Clairol Herbal Essence Moisturizing Lotion, Dry Skin  Clairol Herbal Essence Moisturizing Lotion, Extra Dry Skin  Clairol Herbal Essence Moisturizing Lotion, Normal Skin  Curel Age Defying Therapeutic Moisturizing Lotion with Alpha Hydroxy  Curel Extreme Care Body Lotion  Curel Soothing Hands Moisturizing Hand Lotion  Curel Therapeutic Moisturizing Cream, Fragrance-Free  Curel Therapeutic Moisturizing Lotion, Fragrance-Free  Curel Therapeutic Moisturizing Lotion, Original Formula  Eucerin Daily Replenishing Lotion  Eucerin Dry Skin Therapy  Plus Alpha Hydroxy Crme  Eucerin Dry Skin Therapy Plus Alpha Hydroxy Lotion  Eucerin Original Crme  Eucerin Original Lotion  Eucerin Plus Crme Eucerin Plus Lotion  Eucerin TriLipid Replenishing Lotion  Keri Anti-Bacterial Hand Lotion  Keri Deep Conditioning Original Lotion Dry Skin Formula Softly Scented  Keri Deep Conditioning Original Lotion, Fragrance Free Sensitive Skin Formula  Keri Lotion Fast Absorbing Fragrance Free Sensitive Skin Formula  Keri Lotion Fast Absorbing Softly Scented Dry Skin Formula  Keri Original Lotion  Keri Skin Renewal Lotion Keri Silky Smooth Lotion  Keri Silky Smooth Sensitive Skin Lotion  Nivea Body Creamy Conditioning Oil  Nivea Body Extra Enriched Lotion  Nivea Body Original Lotion  Nivea Body Sheer Moisturizing Lotion Nivea Crme  Nivea Skin Firming Lotion  NutraDerm 30 Skin Lotion  NutraDerm Skin Lotion  NutraDerm Therapeutic Skin Cream  NutraDerm Therapeutic Skin Lotion  ProShield Protective Hand Cream  Provon moisturizing lotion  Please read over the following fact sheets that  you were given.

## 2023-05-26 ENCOUNTER — Other Ambulatory Visit: Payer: Self-pay

## 2023-05-26 ENCOUNTER — Encounter (HOSPITAL_COMMUNITY)
Admission: RE | Admit: 2023-05-26 | Discharge: 2023-05-26 | Disposition: A | Discharge status: Home / Self Care | Payer: BC Managed Care – PPO | Source: Ambulatory Visit | Attending: Neurological Surgery | Admitting: Neurological Surgery

## 2023-05-26 ENCOUNTER — Encounter (HOSPITAL_COMMUNITY): Payer: Self-pay

## 2023-05-26 VITALS — BP 126/83 | HR 95 | Temp 98.2°F | Resp 17 | Ht 66.0 in | Wt 247.6 lb

## 2023-05-26 DIAGNOSIS — Z01812 Encounter for preprocedural laboratory examination: Secondary | ICD-10-CM | POA: Diagnosis not present

## 2023-05-26 DIAGNOSIS — I472 Ventricular tachycardia, unspecified: Secondary | ICD-10-CM | POA: Diagnosis not present

## 2023-05-26 DIAGNOSIS — I4719 Other supraventricular tachycardia: Secondary | ICD-10-CM | POA: Diagnosis not present

## 2023-05-26 DIAGNOSIS — R002 Palpitations: Secondary | ICD-10-CM | POA: Diagnosis not present

## 2023-05-26 DIAGNOSIS — I77810 Thoracic aortic ectasia: Secondary | ICD-10-CM | POA: Insufficient documentation

## 2023-05-26 DIAGNOSIS — I493 Ventricular premature depolarization: Secondary | ICD-10-CM | POA: Insufficient documentation

## 2023-05-26 DIAGNOSIS — M4802 Spinal stenosis, cervical region: Secondary | ICD-10-CM | POA: Diagnosis not present

## 2023-05-26 DIAGNOSIS — Z87891 Personal history of nicotine dependence: Secondary | ICD-10-CM | POA: Diagnosis not present

## 2023-05-26 DIAGNOSIS — Z79899 Other long term (current) drug therapy: Secondary | ICD-10-CM | POA: Insufficient documentation

## 2023-05-26 DIAGNOSIS — Z8249 Family history of ischemic heart disease and other diseases of the circulatory system: Secondary | ICD-10-CM | POA: Diagnosis not present

## 2023-05-26 DIAGNOSIS — Z01818 Encounter for other preprocedural examination: Secondary | ICD-10-CM

## 2023-05-26 DIAGNOSIS — G9589 Other specified diseases of spinal cord: Secondary | ICD-10-CM | POA: Insufficient documentation

## 2023-05-26 HISTORY — DX: Anxiety disorder, unspecified: F41.9

## 2023-05-26 LAB — CBC
HCT: 45.5 % (ref 36.0–46.0)
Hemoglobin: 15.2 g/dL — ABNORMAL HIGH (ref 12.0–15.0)
MCH: 31.2 pg (ref 26.0–34.0)
MCHC: 33.4 g/dL (ref 30.0–36.0)
MCV: 93.4 fL (ref 80.0–100.0)
Platelets: 417 10*3/uL — ABNORMAL HIGH (ref 150–400)
RBC: 4.87 MIL/uL (ref 3.87–5.11)
RDW: 13.8 % (ref 11.5–15.5)
WBC: 14.9 10*3/uL — ABNORMAL HIGH (ref 4.0–10.5)
nRBC: 0 % (ref 0.0–0.2)

## 2023-05-26 LAB — BASIC METABOLIC PANEL
Anion gap: 10 (ref 5–15)
BUN: 18 mg/dL (ref 6–20)
CO2: 21 mmol/L — ABNORMAL LOW (ref 22–32)
Calcium: 9.3 mg/dL (ref 8.9–10.3)
Chloride: 102 mmol/L (ref 98–111)
Creatinine, Ser: 0.87 mg/dL (ref 0.44–1.00)
GFR, Estimated: >60 mL/min (ref >60)
Glucose, Bld: 88 mg/dL (ref 70–99)
Potassium: 3.8 mmol/L (ref 3.5–5.1)
Sodium: 133 mmol/L — ABNORMAL LOW (ref 135–145)

## 2023-05-26 LAB — PROTIME-INR
INR: 1 (ref 0.8–1.2)
Prothrombin Time: 13.3 seconds (ref 11.4–15.2)

## 2023-05-26 LAB — TYPE AND SCREEN
ABO/RH(D): A POS
Antibody Screen: NEGATIVE

## 2023-05-26 LAB — SURGICAL PCR SCREEN
MRSA, PCR: NEGATIVE
Staphylococcus aureus: NEGATIVE

## 2023-05-26 NOTE — Progress Notes (Signed)
Anesthesia Chart Review:  Case: [id number] Date/Time: 05/29/23 1621   Procedure: ACDF - C4-C5 - C5-C6 - C6-C7   Anesthesia type: General   Pre-op diagnosis: Cervical myelopathy   Location: [facility] ROOM 21 / [facility] OR   Surgeons: Arman Bogus, MD       DISCUSSION: Patient is a 52 year old female scheduled for the above procedure.   History includes former smoker (quit 12/10/21), arrhythmia (palpitations, NSVT, PAT, PVCs), dilated ascending thoracic aorta (4 cm 04/05/21 CTA; 3.4 cm aortic root, 3.5 cm ascending aorta 06/22/21 echo), migraines.  She had evaluation by cardiologist Dr. Servando Salina in 2022 due to palpitations, dyspnea, fatigue with family history of CAD. May 2022 event monitor showed NSVT, PSVT/AT. She subsequently had an unremarkable stress test and echo.  Last visit 11/02/22. She was still having some palpitations and had been intolerant to Toprol XL and Cardizem (due to low BP). She was started on propanolol. At PAT, she reported that she is tolerating propanolol which is helping control her palpitations.   WBC 14.9. She is on prednisone, appears she was started on this 05/18/23 following MRI results. Prednisone may be contributing to mild leukocytosis. She denied SOB, cough, fever, chest pain at PAT.    Anesthesia team to evaluate on the day of surgery.    VS: BP (!) 120/102   Pulse 95   Temp 36.8 C   Resp 17   Ht 5\' 6"  (1.676 m)   Wt 112.3 kg   SpO2 95%   BMI 39.96 kg/m  BP Readings from Last 3 Encounters:  05/26/23 126/83  05/17/23 110/70  03/27/23 132/62    PROVIDERS: Blane Ohara, MD is PCP  Thomasene Ripple, DO is cardiologist   LABS: Labs reviewed: Acceptable for surgery. (all labs ordered are listed, but only abnormal results are displayed)  Labs Reviewed  BASIC METABOLIC PANEL - Abnormal; Notable for the following components:      Result Value   Sodium 133 (*)    CO2 21 (*)    All other components within normal limits  CBC - Abnormal; Notable for the  following components:   WBC 14.9 (*)    Hemoglobin 15.2 (*)    Platelets 417 (*)    All other components within normal limits  SURGICAL PCR SCREEN  PROTIME-INR  TYPE AND SCREEN     IMAGES: CT C-spine 05/23/23: IMPRESSION: 1. C4-C5 moderate spinal canal stenosis and severe bilateral neural foraminal narrowing. 2. C5-C6 severe spinal canal stenosis, with moderate right and moderate to severe left neural foraminal narrowing. 3. C6-C7 moderate to severe spinal canal stenosis.  MRI C-spine 05/18/23: IMPRESSION: 1. 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 2. Moderate spinal canal stenosis at C4-C5. 3. Severe bilateral neural foraminal narrowing at C4-C5. Severe left and moderate right neural foraminal narrowing at C5-C6.   CTA Chest 04/05/21 ([facility]): IMPRESSION:  1. No pulmonary embolus.  2. Mild bronchial thickening, can be seen with bronchitis, reactive  airways disease, or smoking.  3. Mild fusiform dilatation of the ascending aorta at 4 cm. No  dissection or acute aortic findings. Recommend annual imaging  followup by CTA or MRA. This recommendation follows 2010  ACCF/AHA/AATS/ACR/ASA/SCA/SCAI/SIR/STS/SVM Guidelines for the  Diagnosis and Management of Patients with Thoracic Aortic Disease.  Circulation. 2010; 121: J478-G956. Aortic aneurysm NOS (ICD10-I71.9)   MRA Head/Neck 09/17/20: IMPRESSION: 1. Unremarkable MRI of the brain. 2.  Unremarkable MRA of the head and neck. No evidence of hemodynamically significant flow-limiting stenosis, dissection or proximal branch occlusion. 3. Anatomical variants: Persistent left trigeminal artery, fetal right PCA, left vertebral artery origin ating from the aortic arch.   EKG: EKG 11/02/22: NSR   CV: Echo 06/22/21: IMPRESSIONS   1. Left ventricular ejection fraction, by estimation, is 60 to 65%. The   left ventricle has normal function. The left ventricle has no regional  wall motion abnormalities. Left ventricular diastolic parameters were  normal.   2. Right ventricular systolic function is normal. The right ventricular  size is normal. There is normal pulmonary artery systolic pressure.   3. The mitral valve is normal in structure. No evidence of mitral valve  regurgitation. No evidence of mitral stenosis.   4. The aortic valve is tricuspid. Aortic valve regurgitation is not  visualized. No aortic stenosis is present.   5. The inferior vena cava is normal in size with greater than 50%  respiratory variability, suggesting right atrial pressure of 3 mmHg.    Nuclear stress test 06/10/21: Nuclear stress EF: 67%. There was no ST segment deviation noted during stress. No T wave inversion was noted during stress. The left ventricular ejection fraction is normal (55-65%). The study is normal. This is a low risk study.   Long term monitor 05/11/21 (starting 04/20/21): Conclusion- This study is remarkable for the following: 1.  Nonsustained ventricular tachycardia  2.  Paroxysmal supraventricular tachycardia which is likely atrial tachycardia.   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   Found on CT scan 2022   Ascending aortic aneurysm (HCC) 04/20/2021   BMI 36.0-36.9,adult 07/09/2020   Chest pain 04/08/2021   Family history of premature CAD 04/20/2021   Fatigue 06/01/2021   GERD (gastroesophageal reflux disease) 04/08/2021   Major depressive disorder, recurrent, moderate (HCC)    Migraine without aura, not intractable, without status migrainosus    NSVT (nonsustained ventricular tachycardia) (HCC) 06/01/2021   Palpitations 04/20/2021   PAT (paroxysmal atrial tachycardia) 06/01/2021   PVC's (premature ventricular contractions) 04/20/2021   Shortness of breath 06/01/2021   Shoulder pain, left 01/20/2021   Tobacco use  04/20/2021    Past Surgical History:  Procedure Laterality Date   DILATION AND CURETTAGE OF UTERUS     multiple miscarriages   exploratory laparotomy multiple times for endometriosis     KNEE ARTHROSCOPY Right    TONSILLECTOMY     TUBAL LIGATION     VAGINAL HYSTERECTOMY      MEDICATIONS:  atomoxetine (STRATTERA) 40 MG capsule   Cholecalciferol (VITAMIN D) 50 MCG (2000 UT) CAPS   cyclobenzaprine (FLEXERIL) 5 MG tablet   gabapentin (NEURONTIN) 100 MG capsule   Gabapentin, Once-Daily, 300 MG TABS   meloxicam (MOBIC) 15 MG tablet   predniSONE (DELTASONE) 20 MG tablet   propranolol (INDERAL) 20 MG tablet   SUMAtriptan (IMITREX) 100 MG tablet   topiramate (TOPAMAX) 50 MG tablet   No current facility-administered medications for this encounter.    Shonna Chock, PA-C Surgical Short Stay/Anesthesiology [facility] Phone [phone] [facility] Phone [phone] 05/26/2023 4:15 PM

## 2023-05-26 NOTE — Anesthesia Preprocedure Evaluation (Signed)
Anesthesia Evaluation  Patient identified by MRN, date of birth, ID band Patient awake    Reviewed: Allergy & Precautions, H&P , NPO status , Patient's Chart, lab work & pertinent test results  Airway Mallampati: II  TM Distance: >3 FB Neck ROM: Limited    Dental no notable dental hx.    Pulmonary former smoker   Pulmonary exam normal breath sounds clear to auscultation       Cardiovascular Normal cardiovascular exam+ dysrhythmias  Rhythm:Regular Rate:Normal  History includes former smoker (quit 12/10/21), arrhythmia (palpitations, NSVT, PAT, PVCs), dilated ascending thoracic aorta (4 cm 04/05/21   Neuro/Psych   Anxiety Depression    negative neurological ROS     GI/Hepatic Neg liver ROS,GERD  ,,  Endo/Other    Morbid obesity  Renal/GU negative Renal ROS  negative genitourinary   Musculoskeletal negative musculoskeletal ROS (+)    Abdominal   Peds negative pediatric ROS (+)  Hematology negative hematology ROS (+)   Anesthesia Other Findings   Reproductive/Obstetrics negative OB ROS                             Anesthesia Physical Anesthesia Plan  ASA: 3  Anesthesia Plan: General   Post-op Pain Management: Ofirmev IV (intra-op)* and Ketamine IV*   Induction: Intravenous  PONV Risk Score and Plan: Ondansetron, Dexamethasone, Midazolam and Treatment may vary due to age or medical condition  Airway Management Planned: Oral ETT  Additional Equipment:   Intra-op Plan:   Post-operative Plan: Extubation in OR  Informed Consent: I have reviewed the patients History and Physical, chart, labs and discussed the procedure including the risks, benefits and alternatives for the proposed anesthesia with the patient or authorized representative who has indicated his/her understanding and acceptance.     Dental advisory given  Plan Discussed with: CRNA and Surgeon  Anesthesia Plan  Comments: (PAT note written 05/26/2023 by Shonna Chock, PA-C.  )       Anesthesia Quick Evaluation

## 2023-05-26 NOTE — Progress Notes (Signed)
PCP - Dr. Blane Ohara Cardiologist - Dr. Thomasene Ripple  PPM/ICD - denies  Chest x-ray - 04/05/21 EKG - 11/02/22 Stress Test - 06/10/21 ECHO - 06/22/21 Cardiac Cath - denies  Sleep Study - denies   DM- denies  ASA/Blood Thinner Instructions: n/a   ERAS Protcol - yes, no drink   COVID TEST- n/a   Anesthesia review: yes, cardiac hx  Patient denies shortness of breath, fever, cough and chest pain at PAT appointment   All instructions explained to the patient, with a verbal understanding of the material. Patient agrees to go over the instructions while at home for a better understanding.  The opportunity to ask questions was provided.

## 2023-05-29 ENCOUNTER — Inpatient Hospital Stay (HOSPITAL_COMMUNITY): Payer: BC Managed Care – PPO

## 2023-05-29 ENCOUNTER — Other Ambulatory Visit: Payer: Self-pay

## 2023-05-29 ENCOUNTER — Ambulatory Visit: Payer: BC Managed Care – PPO

## 2023-05-29 ENCOUNTER — Encounter (HOSPITAL_COMMUNITY): Payer: Self-pay | Admitting: Neurological Surgery

## 2023-05-29 ENCOUNTER — Inpatient Hospital Stay (HOSPITAL_COMMUNITY): Payer: BC Managed Care – PPO | Admitting: Vascular Surgery

## 2023-05-29 ENCOUNTER — Ambulatory Visit (HOSPITAL_COMMUNITY)
Admission: RE | Disposition: A | Discharge status: Home / Self Care | Payer: Self-pay | Source: Home / Self Care | Attending: Neurological Surgery

## 2023-05-29 ENCOUNTER — Inpatient Hospital Stay (HOSPITAL_COMMUNITY)
Admission: RE | Admit: 2023-05-29 | Discharge: 2023-05-30 | DRG: 472 | Disposition: A | Discharge status: Home / Self Care | Payer: BC Managed Care – PPO | Attending: Neurological Surgery | Admitting: Neurological Surgery

## 2023-05-29 DIAGNOSIS — Z886 Allergy status to analgesic agent status: Secondary | ICD-10-CM | POA: Diagnosis not present

## 2023-05-29 DIAGNOSIS — M50223 Other cervical disc displacement at C6-C7 level: Secondary | ICD-10-CM | POA: Diagnosis not present

## 2023-05-29 DIAGNOSIS — Z803 Family history of malignant neoplasm of breast: Secondary | ICD-10-CM | POA: Diagnosis not present

## 2023-05-29 DIAGNOSIS — Z87891 Personal history of nicotine dependence: Secondary | ICD-10-CM

## 2023-05-29 DIAGNOSIS — Z79899 Other long term (current) drug therapy: Secondary | ICD-10-CM

## 2023-05-29 DIAGNOSIS — M50222 Other cervical disc displacement at C5-C6 level: Secondary | ICD-10-CM | POA: Diagnosis not present

## 2023-05-29 DIAGNOSIS — Z981 Arthrodesis status: Principal | ICD-10-CM

## 2023-05-29 DIAGNOSIS — Z6841 Body Mass Index (BMI) 40.0 and over, adult: Secondary | ICD-10-CM

## 2023-05-29 DIAGNOSIS — Z83438 Family history of other disorder of lipoprotein metabolism and other lipidemia: Secondary | ICD-10-CM

## 2023-05-29 DIAGNOSIS — Z8249 Family history of ischemic heart disease and other diseases of the circulatory system: Secondary | ICD-10-CM | POA: Diagnosis not present

## 2023-05-29 DIAGNOSIS — Z8349 Family history of other endocrine, nutritional and metabolic diseases: Secondary | ICD-10-CM

## 2023-05-29 DIAGNOSIS — I499 Cardiac arrhythmia, unspecified: Secondary | ICD-10-CM | POA: Diagnosis not present

## 2023-05-29 DIAGNOSIS — M4802 Spinal stenosis, cervical region: Principal | ICD-10-CM | POA: Diagnosis present

## 2023-05-29 DIAGNOSIS — K219 Gastro-esophageal reflux disease without esophagitis: Secondary | ICD-10-CM | POA: Diagnosis present

## 2023-05-29 DIAGNOSIS — M5412 Radiculopathy, cervical region: Secondary | ICD-10-CM | POA: Diagnosis not present

## 2023-05-29 DIAGNOSIS — M4712 Other spondylosis with myelopathy, cervical region: Secondary | ICD-10-CM | POA: Diagnosis present

## 2023-05-29 DIAGNOSIS — M4722 Other spondylosis with radiculopathy, cervical region: Secondary | ICD-10-CM | POA: Diagnosis present

## 2023-05-29 DIAGNOSIS — Z888 Allergy status to other drugs, medicaments and biological substances status: Secondary | ICD-10-CM

## 2023-05-29 DIAGNOSIS — Z880 Allergy status to penicillin: Secondary | ICD-10-CM | POA: Diagnosis not present

## 2023-05-29 DIAGNOSIS — F909 Attention-deficit hyperactivity disorder, unspecified type: Secondary | ICD-10-CM | POA: Diagnosis not present

## 2023-05-29 DIAGNOSIS — Z823 Family history of stroke: Secondary | ICD-10-CM

## 2023-05-29 DIAGNOSIS — M4322 Fusion of spine, cervical region: Secondary | ICD-10-CM | POA: Diagnosis not present

## 2023-05-29 HISTORY — PX: ANTERIOR CERVICAL DECOMP/DISCECTOMY FUSION: SHX1161

## 2023-05-29 LAB — ABO/RH: ABO/RH(D): A POS

## 2023-05-29 SURGERY — ANTERIOR CERVICAL DECOMPRESSION/DISCECTOMY FUSION 3 LEVELS
Anesthesia: General | Site: Spine Cervical

## 2023-05-29 MED ORDER — SODIUM CHLORIDE 0.9% FLUSH
3.0000 mL | Freq: Two times a day (BID) | INTRAVENOUS | Status: DC
  Administered 2023-05-29: 3 mL via INTRAVENOUS

## 2023-05-29 MED ORDER — SUGAMMADEX SODIUM 200 MG/2ML IV SOLN
INTRAVENOUS | Status: DC | PRN
  Administered 2023-05-29: 400 mg via INTRAVENOUS

## 2023-05-29 MED ORDER — THROMBIN 20000 UNITS EX SOLR
CUTANEOUS | Status: DC | PRN
  Administered 2023-05-29: 20 mL via TOPICAL

## 2023-05-29 MED ORDER — MIDAZOLAM HCL 2 MG/2ML IJ SOLN
INTRAMUSCULAR | Status: AC
  Filled 2023-05-29: qty 2

## 2023-05-29 MED ORDER — THROMBIN 5000 UNITS EX SOLR
OROMUCOSAL | Status: DC | PRN
  Administered 2023-05-29 (×2): 5 mL via TOPICAL

## 2023-05-29 MED ORDER — SODIUM CHLORIDE 0.9 % IV SOLN: 250.0000 mL | INTRAVENOUS | Status: DC

## 2023-05-29 MED ORDER — ONDANSETRON HCL 4 MG/2ML IJ SOLN
INTRAMUSCULAR | Status: AC
  Filled 2023-05-29: qty 2

## 2023-05-29 MED ORDER — PROPRANOLOL HCL 10 MG PO TABS
10.0000 mg | ORAL_TABLET | Freq: Two times a day (BID) | ORAL | Status: DC
  Administered 2023-05-29: 10 mg via ORAL
  Filled 2023-05-29: qty 1

## 2023-05-29 MED ORDER — PHENYLEPHRINE 80 MCG/ML (10ML) SYRINGE FOR IV PUSH (FOR BLOOD PRESSURE SUPPORT)
PREFILLED_SYRINGE | INTRAVENOUS | Status: AC
  Filled 2023-05-29: qty 20

## 2023-05-29 MED ORDER — KETAMINE HCL 50 MG/5ML IJ SOSY
PREFILLED_SYRINGE | INTRAMUSCULAR | Status: AC
  Filled 2023-05-29: qty 5

## 2023-05-29 MED ORDER — SODIUM CHLORIDE 0.9% FLUSH: 3.0000 mL | INTRAVENOUS | Status: DC | PRN

## 2023-05-29 MED ORDER — MENTHOL 3 MG MT LOZG: 1.0000 | LOZENGE | OROMUCOSAL | Status: DC | PRN

## 2023-05-29 MED ORDER — 0.9 % SODIUM CHLORIDE (POUR BTL) OPTIME
TOPICAL | Status: DC | PRN
  Administered 2023-05-29: 1000 mL

## 2023-05-29 MED ORDER — ACETAMINOPHEN 500 MG PO TABS
1000.0000 mg | ORAL_TABLET | Freq: Four times a day (QID) | ORAL | Status: DC
  Administered 2023-05-29 – 2023-05-30 (×2): 1000 mg via ORAL
  Filled 2023-05-29 (×2): qty 2

## 2023-05-29 MED ORDER — ROCURONIUM BROMIDE 10 MG/ML (PF) SYRINGE
PREFILLED_SYRINGE | INTRAVENOUS | Status: AC
  Filled 2023-05-29: qty 20

## 2023-05-29 MED ORDER — PROPOFOL 10 MG/ML IV BOLUS
INTRAVENOUS | Status: DC | PRN
  Administered 2023-05-29: 150 mg via INTRAVENOUS

## 2023-05-29 MED ORDER — THROMBIN 20000 UNITS EX SOLR
CUTANEOUS | Status: AC
  Filled 2023-05-29: qty 20000

## 2023-05-29 MED ORDER — EPHEDRINE 5 MG/ML INJ
INTRAVENOUS | Status: AC
  Filled 2023-05-29: qty 5

## 2023-05-29 MED ORDER — DEXAMETHASONE SODIUM PHOSPHATE 10 MG/ML IJ SOLN
INTRAMUSCULAR | Status: AC
  Filled 2023-05-29: qty 1

## 2023-05-29 MED ORDER — DEXMEDETOMIDINE HCL IN NACL 80 MCG/20ML IV SOLN
INTRAVENOUS | Status: DC | PRN
  Administered 2023-05-29: 8 ug via INTRAVENOUS

## 2023-05-29 MED ORDER — ONDANSETRON HCL 4 MG PO TABS: 4.0000 mg | ORAL_TABLET | Freq: Four times a day (QID) | ORAL | Status: DC | PRN

## 2023-05-29 MED ORDER — DEXMEDETOMIDINE HCL IN NACL 80 MCG/20ML IV SOLN
INTRAVENOUS | Status: AC
  Filled 2023-05-29: qty 20

## 2023-05-29 MED ORDER — CHLORHEXIDINE GLUCONATE 0.12 % MT SOLN
15.0000 mL | Freq: Once | OROMUCOSAL | Status: AC
  Administered 2023-05-29: 15 mL via OROMUCOSAL
  Filled 2023-05-29: qty 15

## 2023-05-29 MED ORDER — PHENYLEPHRINE 80 MCG/ML (10ML) SYRINGE FOR IV PUSH (FOR BLOOD PRESSURE SUPPORT)
PREFILLED_SYRINGE | INTRAVENOUS | Status: AC
  Filled 2023-05-29: qty 10

## 2023-05-29 MED ORDER — OXYCODONE HCL 5 MG PO TABS
5.0000 mg | ORAL_TABLET | ORAL | Status: DC | PRN
  Administered 2023-05-29 (×2): 5 mg via ORAL
  Administered 2023-05-30: 10 mg via ORAL
  Administered 2023-05-30: 5 mg via ORAL
  Filled 2023-05-29: qty 1
  Filled 2023-05-29: qty 2
  Filled 2023-05-29: qty 1
  Filled 2023-05-29: qty 2

## 2023-05-29 MED ORDER — LACTATED RINGERS IV SOLN: INTRAVENOUS | Status: DC

## 2023-05-29 MED ORDER — ROCURONIUM BROMIDE 10 MG/ML (PF) SYRINGE
PREFILLED_SYRINGE | INTRAVENOUS | Status: DC | PRN
  Administered 2023-05-29: 70 mg via INTRAVENOUS
  Administered 2023-05-29 (×3): 30 mg via INTRAVENOUS

## 2023-05-29 MED ORDER — BUPIVACAINE HCL (PF) 0.25 % IJ SOLN
INTRAMUSCULAR | Status: DC | PRN
  Administered 2023-05-29: 10 mL

## 2023-05-29 MED ORDER — LIDOCAINE 2% (20 MG/ML) 5 ML SYRINGE
INTRAMUSCULAR | Status: DC | PRN
  Administered 2023-05-29: 100 mg via INTRAVENOUS

## 2023-05-29 MED ORDER — ONDANSETRON HCL 4 MG/2ML IJ SOLN
INTRAMUSCULAR | Status: DC | PRN
  Administered 2023-05-29: 4 mg via INTRAVENOUS

## 2023-05-29 MED ORDER — CYCLOBENZAPRINE HCL 10 MG PO TABS
5.0000 mg | ORAL_TABLET | Freq: Three times a day (TID) | ORAL | Status: DC | PRN
  Administered 2023-05-29: 5 mg via ORAL

## 2023-05-29 MED ORDER — VANCOMYCIN HCL 1250 MG/250ML IV SOLN
1250.0000 mg | Freq: Once | INTRAVENOUS | Status: AC
  Administered 2023-05-30: 1250 mg via INTRAVENOUS
  Filled 2023-05-29: qty 250

## 2023-05-29 MED ORDER — ONDANSETRON HCL 4 MG/2ML IJ SOLN: 4.0000 mg | Freq: Four times a day (QID) | INTRAMUSCULAR | Status: DC | PRN

## 2023-05-29 MED ORDER — LIDOCAINE 2% (20 MG/ML) 5 ML SYRINGE
INTRAMUSCULAR | Status: AC
  Filled 2023-05-29: qty 5

## 2023-05-29 MED ORDER — THROMBIN 5000 UNITS EX SOLR
CUTANEOUS | Status: AC
  Filled 2023-05-29: qty 5000

## 2023-05-29 MED ORDER — OXYCODONE HCL 5 MG/5ML PO SOLN: 5.0000 mg | Freq: Once | ORAL | Status: DC | PRN

## 2023-05-29 MED ORDER — ACETAMINOPHEN 500 MG PO TABS
1000.0000 mg | ORAL_TABLET | ORAL | Status: AC
  Administered 2023-05-29: 1000 mg via ORAL
  Filled 2023-05-29: qty 2

## 2023-05-29 MED ORDER — DEXAMETHASONE SODIUM PHOSPHATE 10 MG/ML IJ SOLN
INTRAMUSCULAR | Status: DC | PRN
  Administered 2023-05-29: 10 mg via INTRAVENOUS

## 2023-05-29 MED ORDER — OXYCODONE HCL 5 MG PO TABS: 5.0000 mg | ORAL_TABLET | Freq: Once | ORAL | Status: DC | PRN

## 2023-05-29 MED ORDER — GABAPENTIN 300 MG PO CAPS
300.0000 mg | ORAL_CAPSULE | Freq: Three times a day (TID) | ORAL | Status: DC
  Administered 2023-05-29 – 2023-05-30 (×2): 300 mg via ORAL
  Filled 2023-05-29 (×2): qty 1

## 2023-05-29 MED ORDER — MORPHINE SULFATE (PF) 2 MG/ML IV SOLN: 2.0000 mg | INTRAVENOUS | Status: DC | PRN

## 2023-05-29 MED ORDER — FENTANYL CITRATE (PF) 250 MCG/5ML IJ SOLN
INTRAMUSCULAR | Status: AC
  Filled 2023-05-29: qty 5

## 2023-05-29 MED ORDER — OXYCODONE HCL 5 MG PO TABS: 5.0000 mg | ORAL_TABLET | ORAL | Status: DC | PRN

## 2023-05-29 MED ORDER — GLYCOPYRROLATE 0.2 MG/ML IJ SOLN
INTRAMUSCULAR | Status: DC | PRN
  Administered 2023-05-29: .2 mg via INTRAVENOUS

## 2023-05-29 MED ORDER — EPHEDRINE SULFATE-NACL 50-0.9 MG/10ML-% IV SOSY
PREFILLED_SYRINGE | INTRAVENOUS | Status: DC | PRN
  Administered 2023-05-29 (×2): 10 mg via INTRAVENOUS

## 2023-05-29 MED ORDER — DEXAMETHASONE 4 MG PO TABS
4.0000 mg | ORAL_TABLET | Freq: Four times a day (QID) | ORAL | Status: DC
  Administered 2023-05-29 – 2023-05-30 (×2): 4 mg via ORAL
  Filled 2023-05-29 (×2): qty 1

## 2023-05-29 MED ORDER — ONDANSETRON HCL 4 MG/2ML IJ SOLN: 4.0000 mg | Freq: Once | INTRAMUSCULAR | Status: DC | PRN

## 2023-05-29 MED ORDER — PROPOFOL 10 MG/ML IV BOLUS
INTRAVENOUS | Status: AC
  Filled 2023-05-29: qty 20

## 2023-05-29 MED ORDER — HYDROMORPHONE HCL 1 MG/ML IJ SOLN
INTRAMUSCULAR | Status: AC
  Filled 2023-05-29: qty 1

## 2023-05-29 MED ORDER — KETAMINE HCL 10 MG/ML IJ SOLN
INTRAMUSCULAR | Status: DC | PRN
  Administered 2023-05-29: 50 mg via INTRAVENOUS

## 2023-05-29 MED ORDER — VANCOMYCIN HCL 1500 MG/300ML IV SOLN
1500.0000 mg | INTRAVENOUS | Status: AC
  Administered 2023-05-29: 1500 mg via INTRAVENOUS
  Filled 2023-05-29 (×2): qty 300

## 2023-05-29 MED ORDER — DEXAMETHASONE SODIUM PHOSPHATE 4 MG/ML IJ SOLN: 4.0000 mg | Freq: Four times a day (QID) | INTRAMUSCULAR | Status: DC

## 2023-05-29 MED ORDER — SENNA 8.6 MG PO TABS
1.0000 | ORAL_TABLET | Freq: Two times a day (BID) | ORAL | Status: DC
  Administered 2023-05-29: 8.6 mg via ORAL
  Filled 2023-05-29: qty 1

## 2023-05-29 MED ORDER — CHLORHEXIDINE GLUCONATE CLOTH 2 % EX PADS: 6.0000 | MEDICATED_PAD | Freq: Once | CUTANEOUS | Status: DC

## 2023-05-29 MED ORDER — FENTANYL CITRATE (PF) 250 MCG/5ML IJ SOLN
INTRAMUSCULAR | Status: DC | PRN
  Administered 2023-05-29 (×5): 50 ug via INTRAVENOUS

## 2023-05-29 MED ORDER — BUPIVACAINE HCL (PF) 0.25 % IJ SOLN
INTRAMUSCULAR | Status: AC
  Filled 2023-05-29: qty 30

## 2023-05-29 MED ORDER — CYCLOBENZAPRINE HCL 5 MG PO TABS
5.0000 mg | ORAL_TABLET | Freq: Three times a day (TID) | ORAL | Status: DC | PRN
  Administered 2023-05-30: 5 mg via ORAL
  Filled 2023-05-29: qty 1

## 2023-05-29 MED ORDER — POTASSIUM CHLORIDE IN NACL 20-0.9 MEQ/L-% IV SOLN: INTRAVENOUS | Status: DC

## 2023-05-29 MED ORDER — PHENOL 1.4 % MT LIQD: 1.0000 | OROMUCOSAL | Status: DC | PRN

## 2023-05-29 MED ORDER — PHENYLEPHRINE HCL-NACL 20-0.9 MG/250ML-% IV SOLN
INTRAVENOUS | Status: DC | PRN
  Administered 2023-05-29: 30 ug/min via INTRAVENOUS

## 2023-05-29 MED ORDER — MIDAZOLAM HCL 2 MG/2ML IJ SOLN
INTRAMUSCULAR | Status: DC | PRN
  Administered 2023-05-29: 2 mg via INTRAVENOUS

## 2023-05-29 MED ORDER — ORAL CARE MOUTH RINSE: 15.0000 mL | Freq: Once | OROMUCOSAL | Status: AC

## 2023-05-29 MED ORDER — HYDROMORPHONE HCL 1 MG/ML IJ SOLN
0.2500 mg | INTRAMUSCULAR | Status: DC | PRN
  Administered 2023-05-29 (×4): 0.5 mg via INTRAVENOUS

## 2023-05-29 MED ORDER — GABAPENTIN 300 MG PO CAPS
300.0000 mg | ORAL_CAPSULE | ORAL | Status: DC
  Filled 2023-05-29: qty 1

## 2023-05-29 MED ORDER — GLYCOPYRROLATE PF 0.2 MG/ML IJ SOSY
PREFILLED_SYRINGE | INTRAMUSCULAR | Status: AC
  Filled 2023-05-29: qty 1

## 2023-05-29 MED ORDER — CYCLOBENZAPRINE HCL 10 MG PO TABS
ORAL_TABLET | ORAL | Status: AC
  Filled 2023-05-29: qty 1

## 2023-05-29 SURGICAL SUPPLY — 55 items
ADH SKN CLS APL DERMABOND .7 (GAUZE/BANDAGES/DRESSINGS) ×1
APL SKNCLS STERI-STRIP NONHPOA (GAUZE/BANDAGES/DRESSINGS) ×1
BAG COUNTER SPONGE SURGICOUNT (BAG) ×1 IMPLANT
BAG SPNG CNTER NS LX DISP (BAG) ×1
BASKET BONE COLLECTION (BASKET) IMPLANT
BENZOIN TINCTURE PRP APPL 2/3 (GAUZE/BANDAGES/DRESSINGS) ×1 IMPLANT
BUR CARBIDE MATCH 3.0 (BURR) ×1 IMPLANT
CANISTER SUCT 3000ML PPV (MISCELLANEOUS) ×1 IMPLANT
DERMABOND ADVANCED .7 DNX12 (GAUZE/BANDAGES/DRESSINGS) IMPLANT
DRAPE C-ARM 42X72 X-RAY (DRAPES) ×2 IMPLANT
DRAPE LAPAROTOMY 100X72 PEDS (DRAPES) ×1 IMPLANT
DRAPE MICROSCOPE SLANT 54X150 (MISCELLANEOUS) ×1 IMPLANT
DRSG OPSITE POSTOP 4X6 (GAUZE/BANDAGES/DRESSINGS) IMPLANT
DURAPREP 6ML APPLICATOR 50/CS (WOUND CARE) ×1 IMPLANT
ELECT COATED BLADE 2.86 ST (ELECTRODE) ×1 IMPLANT
ELECT REM PT RETURN 9FT ADLT (ELECTROSURGICAL) ×1
ELECTRODE REM PT RTRN 9FT ADLT (ELECTROSURGICAL) ×1 IMPLANT
GAUZE 4X4 16PLY ~~LOC~~+RFID DBL (SPONGE) IMPLANT
GLOVE BIO SURGEON STRL SZ7 (GLOVE) IMPLANT
GLOVE BIO SURGEON STRL SZ8 (GLOVE) ×1 IMPLANT
GLOVE BIOGEL PI IND STRL 7.0 (GLOVE) IMPLANT
GOWN STRL REUS W/ TWL LRG LVL3 (GOWN DISPOSABLE) IMPLANT
GOWN STRL REUS W/ TWL XL LVL3 (GOWN DISPOSABLE) ×1 IMPLANT
GOWN STRL REUS W/TWL 2XL LVL3 (GOWN DISPOSABLE) IMPLANT
GOWN STRL REUS W/TWL LRG LVL3 (GOWN DISPOSABLE)
GOWN STRL REUS W/TWL XL LVL3 (GOWN DISPOSABLE) ×1
HEMOSTAT POWDER KIT SURGIFOAM (HEMOSTASIS) ×1 IMPLANT
KIT BASIN OR (CUSTOM PROCEDURE TRAY) ×1 IMPLANT
KIT TURNOVER KIT B (KITS) ×1 IMPLANT
NDL HYPO 25X1 1.5 SAFETY (NEEDLE) ×1 IMPLANT
NDL SPNL 20GX3.5 QUINCKE YW (NEEDLE) ×1 IMPLANT
NEEDLE HYPO 25X1 1.5 SAFETY (NEEDLE) ×1 IMPLANT
NEEDLE SPNL 20GX3.5 QUINCKE YW (NEEDLE) ×1 IMPLANT
NS IRRIG 1000ML POUR BTL (IV SOLUTION) ×1 IMPLANT
PACK LAMINECTOMY NEURO (CUSTOM PROCEDURE TRAY) ×1 IMPLANT
PAD ARMBOARD 7.5X6 YLW CONV (MISCELLANEOUS) ×1 IMPLANT
PATTIES SURGICAL .5 X.5 (GAUZE/BANDAGES/DRESSINGS) IMPLANT
PIN DISTRACTION 14MM (PIN) IMPLANT
PLATE ACP 60X3 LVL NS LF SPNE (Plate) IMPLANT
PLATE ACP INSIGNIA 60 3L (Plate) ×1 IMPLANT
PUTTY BONE 2.5CC (Putty) IMPLANT
SCREW VA SINGLE LEAD 4X14 (Screw) ×8 IMPLANT
SCREW VA SINGLE LEAD 4X14 ST (Screw) IMPLANT
SOL ELECTROSURG ANTI STICK (MISCELLANEOUS) ×1
SOLUTION ELECTROSURG ANTI STCK (MISCELLANEOUS) ×1 IMPLANT
SPACER IDENTITI 7X16X14 7D (Spacer) IMPLANT
SPACER IDENTITI 8X16X14 7D (Spacer) IMPLANT
SPONGE INTESTINAL PEANUT (DISPOSABLE) ×1 IMPLANT
SPONGE SURGIFOAM ABS GEL 100 (HEMOSTASIS) IMPLANT
STRIP CLOSURE SKIN 1/2X4 (GAUZE/BANDAGES/DRESSINGS) ×1 IMPLANT
SUT VIC AB 3-0 SH 8-18 (SUTURE) ×1 IMPLANT
SUT VICRYL 4-0 PS2 18IN ABS (SUTURE) IMPLANT
TOWEL GREEN STERILE (TOWEL DISPOSABLE) ×1 IMPLANT
TOWEL GREEN STERILE FF (TOWEL DISPOSABLE) ×1 IMPLANT
WATER STERILE IRR 1000ML POUR (IV SOLUTION) ×1 IMPLANT

## 2023-05-29 NOTE — Op Note (Signed)
05/29/2023  7:12 PM  PATIENT:  Jeanette Alvarado  52 y.o. 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 Decompressive anterior cervical discectomy C4-5 C5-6 C6-7, 2. Anterior cervical arthrodesis C4-5 C5-6 C6-7 utilizing a PTI interbody cage packed with locally harvested morcellized autologous bone graft and DBM putty, 3. Anterior cervical plating C4-C7 inclusive utilizing a ATEC plate  SURGEON:  Marikay Alar, MD  ASSISTANTS: Verlin Dike FNP  ANESTHESIA:   General  EBL: 50 ml  No intake/output data recorded.  BLOOD ADMINISTERED: none  DRAINS: none  SPECIMEN:  none  INDICATION FOR PROCEDURE: This patient presented with severe left arm pain with left arm weakness. 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 The patient tried conservative measures without relief. Pain was debilitating. Recommended ACDF with plating. Patient understood the risks, benefits, and alternatives and potential outcomes and wished to proceed.  PROCEDURE DETAILS: Patient was brought to the operating room placed under general endotracheal anesthesia. Patient was placed in the supine position on the operating room table. The neck was prepped with Duraprep and draped in a sterile fashion.   Three cc of local anesthesia was injected and a transverse incision was made on the right side of the neck.  Dissection was carried down thru the subcutaneous tissue and the platysma was  elevated, opened, and undermined with Metzenbaum scissors.  Dissection was then carried out thru an avascular plane leaving the sternocleidomastoid carotid artery and jugular vein laterally and the trachea and esophagus medially with the assistance of my nurse practitioner. The ventral aspect of the vertebral column was identified and a localizing x-ray was taken. The C5-6 level was identified and all in the room  agreed with the level. The longus colli muscles were then elevated and the retractor was placed with the assistance of my nurse practitioner expose C4-5 C5-6 and C6-7. The annulus was incised and the disc space entered.  Same discectomy was performed at all 3 levels discectomy was performed with micro-curettes and pituitary rongeurs. I then used the high-speed drill to drill the endplates down to the level of the posterior longitudinal ligament. The drill shavings were saved in a mucous trap for later arthrodesis. The operating microscope was draped and brought into the field provided additional magnification, illumination and visualization. Discectomy was continued posteriorly thru the disc space. Posterior longitudinal ligament was opened with a nerve hook, and then removed along with disc herniation and osteophytes, decompressing the spinal canal and thecal sac.  The ossified posterior longitudinal ligament was stuck to the dura paracentral to the left at all 3 levels.  We spent considerable time trying to peel this away as best we could but at all 3 levels once the dura relaxed and there is no more obvious compression of the dura and the foramen was open, we left small amounts of calcified ligament stuck to the dura rather than risk CSF leak or spinal cord injury.  We tried to be extremely gentle in our decompression.  We then continued to remove osteophytic overgrowth and disc material decompressing the neural foramina and exiting nerve roots bilaterally. The scope was angled up and down to help decompress and undercut the vertebral bodies. Once the decompression was completed we could pass a nerve hook circumferentially to assure adequate decompression in the midline and in the neural foramina. So by both visualization and palpation we felt we had an adequate decompression of  the neural elements. We then measured the height of the intravertebral disc space at C6-7 and selected a 8 millimeter PTI interbody cage  packed with autograft and DBM putty.  At C4-5 and C5-6 a 7 mm PTI cage was used.  It was then gently positioned in the intravertebral disc space(s) and countersunk. I then used a 60 mm ATEC plate and placed 14 mm variable angle screws into the vertebral bodies of each level C4-C7 inclusive and locked them into position. The wound was irrigated with bacitracin solution, checked for hemostasis which was established and confirmed. Once meticulous hemostasis was achieved, we then proceeded with closure with the assistance of my nurse practitioner. The platysma was closed with interrupted 3-0 undyed Vicryl suture, the subcuticular layer was closed with interrupted 3-0 undyed Vicryl suture. The skin edges were approximated with steristrips. The drapes were removed. A sterile dressing was applied. The patient was then awakened from general anesthesia and transferred to the recovery room in stable condition. At the end of the procedure all sponge, needle and instrument counts were correct.   PLAN OF CARE: Admit for overnight observation  PATIENT DISPOSITION:  PACU - hemodynamically stable.   Delay start of Pharmacological VTE agent (>24hrs) due to surgical blood loss or risk of bleeding:  yes

## 2023-05-29 NOTE — H&P (Signed)
Subjective:   Patient is a 52 y.o. female admitted for cervical stenosis with myelopathy. The patient first presented to me with complaints of neck pain, shooting pains in the arm(s), numbness of the arm(s), and loss of strength of the arm(s). Onset of symptoms was a few weeks ago. The pain is described as aching, sharp, and throbbing and occurs all day. The pain is rated severe, and is located in the neck and radiates to the LUE. The symptoms have been progressive. Symptoms are exacerbated by extending head backwards, and are relieved by none.  Previous work up includes MRI of cervical spine, results: spinal stenosis.  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   Found on CT scan 2022   Ascending aortic aneurysm (HCC) 04/20/2021   BMI 36.0-36.9,adult 07/09/2020   Chest pain 04/08/2021   Family history of premature CAD 04/20/2021   Fatigue 06/01/2021   GERD (gastroesophageal reflux disease) 04/08/2021   Major depressive disorder, recurrent, moderate (HCC)    Migraine without aura, not intractable, without status migrainosus    NSVT (nonsustained ventricular tachycardia) (HCC) 06/01/2021   Palpitations 04/20/2021   PAT (paroxysmal atrial tachycardia) 06/01/2021   PVC's (premature ventricular contractions) 04/20/2021   Shortness of breath 06/01/2021   Shoulder pain, left 01/20/2021   Tobacco use 04/20/2021    Past Surgical History:  Procedure Laterality Date   DILATION AND CURETTAGE OF UTERUS     multiple miscarriages   exploratory laparotomy multiple times for endometriosis     KNEE ARTHROSCOPY Right    TONSILLECTOMY     TUBAL LIGATION     VAGINAL HYSTERECTOMY      Allergies  Allergen Reactions   Penicillins Anaphylaxis and Other (See Comments)    Unknown   Aspirin Nausea And Vomiting   Aimovig [Erenumab-Aooe]    Pyridium [Phenazopyridine] Rash    Social History   Tobacco Use   Smoking status: Former    Packs/day: 1.50     Years: 33.00    Additional pack years: 0.00    Total pack years: 49.50    Types: Cigarettes    Quit date: 12/10/2021    Years since quitting: 1.4   Smokeless tobacco: Never  Substance Use Topics   Alcohol use: Not Currently    Family History  Problem Relation Age of Onset   Breast cancer Mother    Hypothyroidism Mother    Hypertension Father    Hyperlipidemia Father    CVA Father    Prior to Admission medications   Medication Sig Start Date End Date Taking? Authorizing Provider  Cholecalciferol (VITAMIN D) 50 MCG (2000 UT) CAPS Take 2,000 Units by mouth daily.   Yes [provider]  cyclobenzaprine (FLEXERIL) 5 MG tablet Take 1 tablet (5 mg total) by mouth every 8 (eight) hours as needed. 05/19/23  Yes Cox, Kirsten, MD  Gabapentin, Once-Daily, 300 MG TABS Take 1 tablet (300 mg total) by mouth in the morning, at noon, and at bedtime. 05/17/23  Yes Cox, Kirsten, MD  predniSONE (DELTASONE) 20 MG tablet Take 2 tablets (40 mg total) by mouth daily. 05/18/23  Yes Cox, Kirsten, MD  propranolol (INDERAL) 20 MG tablet Take 0.5 tablets (10 mg total) by mouth 2 (two) times daily. 03/27/23  Yes Cox, Kirsten, MD  atomoxetine (STRATTERA) 40 MG capsule Take 1 capsule (40 mg total) by mouth daily. Patient not taking: Reported on 05/24/2023 03/27/23   Blane Ohara, MD  gabapentin (NEURONTIN) 100 MG capsule Take 100 mg by mouth daily as needed (pain). 05/14/23   [provider]  meloxicam (MOBIC) 15 MG tablet Take 1 tablet (15 mg total) by mouth daily. Patient not taking: Reported on 05/24/2023 03/31/23   Standiford, Jenelle Mages, DPM  SUMAtriptan (IMITREX) 100 MG tablet Take 1 tablet (100 mg total) by mouth every 2 (two) hours as needed for migraine. May repeat in 2 hours if headache persists or recurs. 05/07/21   Abigail Miyamoto, MD  topiramate (TOPAMAX) 50 MG tablet Take 1 tablet (50 mg total) by mouth 2 (two) times daily. Patient not taking: Reported on 05/17/2023 04/24/23   CoxFritzi Mandes, MD     Review of Systems  Positive ROS: neg  All other systems have been reviewed and were otherwise negative with the exception of those mentioned in the HPI and as above.  Objective: Vital signs in last 24 hours: Temp:  [98 F (36.7 C)] 98 F (36.7 C) (06/17 1428) Pulse Rate:  [79] 79 (06/17 1428) Resp:  [18] 18 (06/17 1428) BP: (127)/(99) 127/99 (06/17 1428) SpO2:  [95 %] 95 % (06/17 1428) Weight:  [113.4 kg] 113.4 kg (06/17 1428)  General Appearance: Alert, cooperative, no distress, appears stated age Head: Normocephalic, without obvious abnormality, atraumatic Eyes: PERRL, conjunctiva/corneas clear, EOM's intact      Neck: Supple, symmetrical, trachea midline, Back: Symmetric, no curvature, ROM normal, no CVA tenderness Lungs:  respirations unlabored Heart: Regular rate and rhythm Abdomen: Soft, non-tender Extremities: Extremities normal, atraumatic, no cyanosis or edema Pulses: 2+ and symmetric all extremities Skin: Skin color, texture, turgor normal, no rashes or lesions  NEUROLOGIC:  Mental status: Alert and oriented x4, no aphasia, good attention span, fund of knowledge and memory  Motor Exam - grossly normal except LUE 4-/5 proximal, 4/5 distal Sensory Exam - grossly normal Reflexes: 3+ Coordination - grossly normal Gait - grossly normal Balance - grossly normal Cranial Nerves: I: smell Not tested  II: visual acuity  OS: nl    OD: nl  II: visual fields Full to confrontation  II: pupils Equal, round, reactive to light  III,VII: ptosis None  III,IV,VI: extraocular muscles  Full ROM  V: mastication Normal  V: facial light touch sensation  Normal  V,VII: corneal reflex  Present  VII: facial muscle function - upper  Normal  VII: facial muscle function - lower Normal  VIII: hearing Not tested  IX: soft palate elevation  Normal  IX,X: gag reflex Present  XI: trapezius strength  5/5  XI: sternocleidomastoid strength 5/5  XI: neck flexion strength   5/5  XII: tongue strength  Normal    Data Review Lab Results  Component Value Date   WBC 14.9 (H) 05/26/2023   HGB 15.2 (H) 05/26/2023   HCT 45.5 05/26/2023   MCV 93.4 05/26/2023   PLT 417 (H) 05/26/2023   Lab Results  Component Value Date   NA 133 (L) 05/26/2023   K 3.8 05/26/2023   CL 102 05/26/2023   CO2 21 (L) 05/26/2023   BUN 18 05/26/2023   CREATININE 0.87 05/26/2023   GLUCOSE 88 05/26/2023   Lab Results  Component Value Date   INR 1.0 05/26/2023    Assessment:   Cervical neck pain with herniated nucleus pulposus/ spondylosis/ stenosis at C4-5 C5-6 C6-7. Estimated body mass index is 40.35 kg/m as calculated from the following:   Height as of this encounter: 5\' 6"  (1.676 m).   Weight as of this encounter:  113.4 kg.  Patient has failed conservative therapy. Planned surgery : ACDF with plate Z6-1 W9-6 C6-7  Plan:   I explained the condition and procedure to the patient and answered any questions.  Patient wishes to proceed with procedure as planned. Understands risks/ benefits/ and expected or typical outcomes.  Tia Alert 05/29/2023 3:29 PM

## 2023-05-29 NOTE — Progress Notes (Signed)
Pharmacy Antibiotic Note  Jeanette Alvarado is a 52 y.o. female admitted on 05/29/2023 with cervical stenosis with myelopathy.  Pharmacy has been consulted for vancomycin surgical prophylaxis dosing.  Patient received vancomycin 1500mg  at 16:00. Confirmed with RN no drain. ClCr ~140ml/min.   Plan: Vancomycin 1250mg  x1 at 4am   Height: 5\' 6"  (167.6 cm) Weight: 113.4 kg (250 lb) IBW/kg (Calculated) : 59.3  Temp (24hrs), Avg:98.3 F (36.8 C), Min:98 F (36.7 C), Max:98.6 F (37 C)  Recent Labs  Lab 05/26/23 1356  WBC 14.9*  CREATININE 0.87    Estimated Creatinine Clearance: 98.8 mL/min (by C-G formula based on SCr of 0.87 mg/dL).    Allergies  Allergen Reactions   Penicillins Anaphylaxis and Other (See Comments)    Unknown   Aspirin Nausea And Vomiting   Aimovig [Erenumab-Aooe]    Pyridium [Phenazopyridine] Rash     Thank you for allowing pharmacy to be a part of this patient's care.  Alphia Moh, PharmD, BCPS, BCCP Clinical Pharmacist  Please check AMION for all [facility] phone numbers After 10:00 PM, call Main Pharmacy [phone]

## 2023-05-29 NOTE — Anesthesia Procedure Notes (Signed)
Procedure Name: Intubation Date/Time: 05/29/2023 4:00 PM  Performed by: Kayleen Memos, CRNAPre-anesthesia Checklist: Patient identified, Emergency Drugs available, Suction available, Patient being monitored and Timeout performed Patient Re-evaluated:Patient Re-evaluated prior to induction Oxygen Delivery Method: Circle system utilized Preoxygenation: Pre-oxygenation with 100% oxygen Induction Type: IV induction Ventilation: Mask ventilation without difficulty Laryngoscope Size: Glidescope and 3 Grade View: Grade I Tube type: Oral Tube size: 7.0 mm Number of attempts: 1 Airway Equipment and Method: Rigid stylet and Video-laryngoscopy Placement Confirmation: ETT inserted through vocal cords under direct vision, positive ETCO2, CO2 detector and breath sounds checked- equal and bilateral Secured at: 22 cm Tube secured with: Tape Dental Injury: Teeth and Oropharynx as per pre-operative assessment

## 2023-05-29 NOTE — Transfer of Care (Signed)
Immediate Anesthesia Transfer of Care Note  Patient: Jeanette Alvarado  Procedure(s) Performed: Anterior Cervical Decompression Fusion - Cervical four-Cervical five - Cervical five-Cervical six - Cervical six-Cervical seven (Spine Cervical)  Patient Location: PACU  Anesthesia Type:General  Level of Consciousness: awake, alert , and oriented  Airway & Oxygen Therapy: Patient Spontanous Breathing  Post-op Assessment: Report given to RN and Post -op Vital signs reviewed and stable  Post vital signs: Reviewed and stable  Last Vitals:  Vitals Value Taken Time  BP 124/80 05/29/23 1918  Temp 36.9 C 05/29/23 1918  Pulse 90 05/29/23 1922  Resp 13 05/29/23 1922  SpO2 94 % 05/29/23 1922  Vitals shown include unvalidated device data.  Last Pain:  Vitals:   05/29/23 1436  TempSrc:   PainSc: 6          Complications: No notable events documented.

## 2023-05-29 NOTE — Anesthesia Postprocedure Evaluation (Signed)
Anesthesia Post Note  Patient: Jeanette Alvarado  Procedure(s) Performed: Anterior Cervical Decompression Fusion - Cervical four-Cervical five - Cervical five-Cervical six - Cervical six-Cervical seven (Spine Cervical)     Patient location during evaluation: PACU Anesthesia Type: General Level of consciousness: sedated and patient cooperative Pain management: pain level controlled Vital Signs Assessment: post-procedure vital signs reviewed and stable Respiratory status: spontaneous breathing Cardiovascular status: stable Anesthetic complications: no   No notable events documented.  Last Vitals:  Vitals:   05/29/23 2015 05/29/23 2045  BP: 115/80 128/85  Pulse: 94 87  Resp: 14 18  Temp: 37 C 36.8 C  SpO2: 93% 93%    Last Pain:  Vitals:   05/29/23 2045  TempSrc: Oral  PainSc:                  Lewie Loron

## 2023-05-30 ENCOUNTER — Encounter (HOSPITAL_COMMUNITY): Payer: Self-pay | Admitting: Neurological Surgery

## 2023-05-30 DIAGNOSIS — Z87891 Personal history of nicotine dependence: Secondary | ICD-10-CM | POA: Diagnosis not present

## 2023-05-30 DIAGNOSIS — Z79899 Other long term (current) drug therapy: Secondary | ICD-10-CM | POA: Diagnosis not present

## 2023-05-30 DIAGNOSIS — M50223 Other cervical disc displacement at C6-C7 level: Secondary | ICD-10-CM | POA: Diagnosis present

## 2023-05-30 DIAGNOSIS — Z888 Allergy status to other drugs, medicaments and biological substances status: Secondary | ICD-10-CM | POA: Diagnosis not present

## 2023-05-30 DIAGNOSIS — Z803 Family history of malignant neoplasm of breast: Secondary | ICD-10-CM | POA: Diagnosis not present

## 2023-05-30 DIAGNOSIS — K219 Gastro-esophageal reflux disease without esophagitis: Secondary | ICD-10-CM | POA: Diagnosis present

## 2023-05-30 DIAGNOSIS — M50222 Other cervical disc displacement at C5-C6 level: Secondary | ICD-10-CM | POA: Diagnosis present

## 2023-05-30 DIAGNOSIS — M4802 Spinal stenosis, cervical region: Secondary | ICD-10-CM | POA: Diagnosis present

## 2023-05-30 DIAGNOSIS — Z8349 Family history of other endocrine, nutritional and metabolic diseases: Secondary | ICD-10-CM | POA: Diagnosis not present

## 2023-05-30 DIAGNOSIS — Z83438 Family history of other disorder of lipoprotein metabolism and other lipidemia: Secondary | ICD-10-CM | POA: Diagnosis not present

## 2023-05-30 DIAGNOSIS — M4712 Other spondylosis with myelopathy, cervical region: Secondary | ICD-10-CM | POA: Diagnosis present

## 2023-05-30 DIAGNOSIS — Z6841 Body Mass Index (BMI) 40.0 and over, adult: Secondary | ICD-10-CM | POA: Diagnosis not present

## 2023-05-30 DIAGNOSIS — Z880 Allergy status to penicillin: Secondary | ICD-10-CM | POA: Diagnosis not present

## 2023-05-30 DIAGNOSIS — Z8249 Family history of ischemic heart disease and other diseases of the circulatory system: Secondary | ICD-10-CM | POA: Diagnosis not present

## 2023-05-30 DIAGNOSIS — Z886 Allergy status to analgesic agent status: Secondary | ICD-10-CM | POA: Diagnosis not present

## 2023-05-30 DIAGNOSIS — Z823 Family history of stroke: Secondary | ICD-10-CM | POA: Diagnosis not present

## 2023-05-30 DIAGNOSIS — M4722 Other spondylosis with radiculopathy, cervical region: Secondary | ICD-10-CM | POA: Diagnosis present

## 2023-05-30 DIAGNOSIS — F909 Attention-deficit hyperactivity disorder, unspecified type: Secondary | ICD-10-CM | POA: Diagnosis present

## 2023-05-30 MED ORDER — CYCLOBENZAPRINE HCL 5 MG PO TABS: 5.0000 mg | ORAL_TABLET | Freq: Three times a day (TID) | ORAL | 0 refills | Status: DC | PRN

## 2023-05-30 MED ORDER — OXYCODONE-ACETAMINOPHEN 5-325 MG PO TABS: 1.0000 | ORAL_TABLET | ORAL | 0 refills | Status: DC | PRN

## 2023-05-30 MED FILL — Thrombin For Soln 5000 Unit: CUTANEOUS | Qty: 5000 | Status: AC

## 2023-05-30 NOTE — Plan of Care (Signed)
Pt doing well. Pt given D/C instructions with verbal understanding. Rx's were sent to the pharmacy by MD. Pt's incision is clean and dry with no sign of infection. Pt's IV was removed prior to D/C. Pt D/C'd home via wheelchair per MD order. Pt is stable @ D/C and has no other needs at this time. Bawi Lakins, RN  

## 2023-05-30 NOTE — Evaluation (Signed)
Occupational Therapy Evaluation Patient Details Name: Jeanette Alvarado MRN: [id number] DOB: May 21, 1971 Today's Date: 05/30/2023   History of Present Illness Pt is a 52 yo female s/p decompressive anterior cervical discectomy C4-5 C5-6 C6-7.Anterior cervical arthrodesis C4-5 C5-6 C6-7 with fusion due to herniated nucleus pulposus/ spondylosis/ stenosis at C4-5 C5-6 C6-7.   Clinical Impression   This 52 yo female admitted and underwent above presents to acute OT with all education completed and post op cervical handout provided. We will DC from acute OT.      Recommendations for follow up therapy are one component of a multi-disciplinary discharge planning process, led by the attending physician.  Recommendations may be updated based on patient status, additional functional criteria and insurance authorization.   Assistance Recommended at Discharge PRN  Patient can return home with the following Assistance with cooking/housework;Assist for transportation    Functional Status Assessment  Patient has had a recent decline in their functional status and demonstrates the ability to make significant improvements in function in a reasonable and predictable amount of time. (without further need for skilled OT, all education completed and post op cervical handout provided)  Equipment Recommendations  None recommended by OT       Precautions / Restrictions Precautions Precautions: Cervical Precaution Booklet Issued: Yes (comment) Required Braces or Orthoses:  (no collar needed per orders) Restrictions Weight Bearing Restrictions: No      Mobility Bed Mobility Overal bed mobility: Modified Independent             General bed mobility comments: HOB all the way up--pt is going to be in a recliner day and night when she is sitting down/sleeping    Transfers Overall transfer level: Modified independent                 General transfer comment: increased time--a bit tenative with  sit<>stand and ambulation but safe      Balance Overall balance assessment: Independent                                         ADL either performed or assessed with clinical judgement   ADL                                         General ADL Comments: Educated pt on sequence of doffing an donning clothing, using cups with straws to drink out of, watch how much she moves her neck in all directions, not bending over to get something (squat down), no reaching full extended arms over her head, no moving her neck too much in shower when she is rinsing off--stand under water directly or use hand held shower.     Vision Baseline Vision/History: 1 Wears glasses Ability to See in Adequate Light: 0 Adequate Patient Visual Report: No change from baseline              Pertinent Vitals/Pain Pain Assessment Pain Assessment: 0-10 Pain Score: 4  Pain Location: incisional Pain Descriptors / Indicators: Aching, Shooting Pain Intervention(s): Limited activity within patient's tolerance, Monitored during session, Premedicated before session     Hand Dominance Right   Extremity/Trunk Assessment Upper Extremity Assessment Upper Extremity Assessment: RUE deficits/detail RUE Deficits / Details: reports left hand is better, but still feels weak in upper arm. MMT  of biceps and triceps show 4/5 (she reports that is better than yesterday)           Communication Communication Communication: No difficulties   Cognition Arousal/Alertness: Awake/alert Behavior During Therapy: WFL for tasks assessed/performed Overall Cognitive Status: Within Functional Limits for tasks assessed                                                  Home Living Family/patient expects to be discharged to:: Private residence Living Arrangements: Children Available Help at Discharge: Family;Available 24 hours/day Type of Home: House Home Access: Stairs to  enter [employer] of Steps: 3 Entrance Stairs-Rails: Right Home Layout: One level     Bathroom Shower/Tub: Producer, television/film/video: Standard     Home Equipment: Hand held shower head   Additional Comments: Dtr can get her a 3n1 if needed; plans on sleeping in a recliner she rented for a month      Prior Functioning/Environment Prior Level of Function : Independent/Modified Independent                        OT Problem List: Decreased strength;Pain         OT Goals(Current goals can be found in the care plan section) Acute Rehab OT Goals Patient Stated Goal: home today         AM-PAC OT "6 Clicks" Daily Activity     Outcome Measure Help from another person eating meals?: None Help from another person taking care of personal grooming?: None Help from another person toileting, which includes using toliet, bedpan, or urinal?: None Help from another person bathing (including washing, rinsing, drying)?: None Help from another person to put on and taking off regular upper body clothing?: None Help from another person to put on and taking off regular lower body clothing?: None 6 Click Score: 24   End of Session Nurse Communication:  (no further OT needs)  Activity Tolerance: Patient tolerated treatment well Patient left: in bed  OT Visit Diagnosis: Pain Pain - part of body:  (incisional)                Time: 0752-0809 OT Time Calculation (min): 17 min Charges:  OT General Charges $OT Visit: 1 Visit OT Evaluation $OT Eval Moderate Complexity: 1 Mod  Cathy L. OT Acute Rehabilitation Services Office [phone]    Jeanette Alvarado 05/30/2023, 11:04 AM

## 2023-05-30 NOTE — Discharge Summary (Signed)
Physician Discharge Summary  Patient ID: Jeanette Alvarado MRN: [id number] DOB/AGE: 51-Jul-1972 51 y.o.  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 The patient tolerated the procedure well and was taken to the recovery room and then to the floor in stable condition. The hospital course was routine. There were no complications. The wound remained clean dry and intact. Pt had appropriate neck soreness. No complaints of arm pain or new N/T/W. The patient remained afebrile with stable vital signs, and tolerated a regular diet. The patient continued to increase activities, and pain was well controlled with oral pain medications.   Consults: None  Significant Diagnostic Studies:  Results for orders placed or performed during the hospital encounter of 05/29/23  ABO/Rh  Result Value Ref Range   ABO/RH(D)      A POS Performed at [facility], [address], [place], [place] [address]     DG Cervical Spine 2 or 3 views  Result Date: 05/29/2023 CLINICAL DATA:  Cervical fusion EXAM: CERVICAL SPINE - 2-3 VIEW COMPARISON:  CT 05/23/2023 FINDINGS: Six low resolution intraoperative spot views of the cervical spine. Total fluoroscopy time was 15 seconds, fluoroscopic dose of 5.06 mGy. Initial lateral view of the cervical spine. Subsequent image demonstrates anterior localizing device anterior to C4-C5. Subsequent images demonstrate anterior plate, fixating screws and interbody device C4 through C7. IMPRESSION: Intraoperative fluoroscopic assistance provided during cervical spine surgery. Electronically Signed   By: Jasmine Pang M.D.   On: 05/29/2023 21:09   DG C-Arm 1-60 Min-No  Report  Result Date: 05/29/2023 Fluoroscopy was utilized by the requesting physician.  No radiographic interpretation.   DG C-Arm 1-60 Min-No Report  Result Date: 05/29/2023 Fluoroscopy was utilized by the requesting physician.  No radiographic interpretation.   DG C-Arm 1-60 Min-No Report  Result Date: 05/29/2023 Fluoroscopy was utilized by the requesting physician.  No radiographic interpretation.   CT CERVICAL SPINE WO CONTRAST  Result Date: 05/23/2023 CLINICAL DATA:  Neck pain and left arm pain for 2 weeks EXAM: CT CERVICAL SPINE WITHOUT CONTRAST TECHNIQUE: Multidetector CT imaging of the cervical spine was performed without intravenous contrast. Multiplanar CT image reconstructions were also generated. RADIATION DOSE REDUCTION: This exam was performed according to the departmental dose-optimization program which includes automated exposure control, adjustment of the mA and/or kV according to patient size and/or use of iterative reconstruction technique. COMPARISON:  05/18/2023 MRI cervical spine FINDINGS: Alignment: Straightening and mild reversal of the normal cervical lordosis. No listhesis. Skull base and vertebrae: No acute fracture. No primary bone lesion or focal pathologic process. Soft tissues and spinal canal: No prevertebral fluid or swelling. No visible canal hematoma. Disc levels: C2-C3: No significant disc bulge. No spinal canal stenosis or neuroforaminal narrowing. C3-C4: No significant disc bulge. Mild right facet arthropathy. No spinal canal stenosis or neural foraminal narrowing. C4-C5: Disc height loss and disc osteophyte complex with mild disc bulge. Facet and uncovertebral hypertrophy. Moderate spinal canal stenosis. Severe bilateral neural foraminal narrowing. C5-C6: Disc height loss and disc osteophyte complex with superimposed left paracentral, partially calcified disc protrusion. Facet and uncovertebral hypertrophy. Severe spinal canal stenosis. Moderate right and moderate  to severe left neural foraminal narrowing. C6-C7: Moderate disc bulge,  left greater than right. Facet and uncovertebral hypertrophy. Moderate to severe spinal canal stenosis. No neural foraminal narrowing. C7-T1: No significant disc bulge. No spinal canal stenosis or neuroforaminal narrowing. Upper chest: No focal pulmonary opacity or pleural effusion. Other: None. IMPRESSION: 1. C4-C5 moderate spinal canal stenosis and severe bilateral neural foraminal narrowing. 2. C5-C6 severe spinal canal stenosis, with moderate right and moderate to severe left neural foraminal narrowing. 3. C6-C7 moderate to severe spinal canal stenosis. Electronically Signed   By: Wiliam Ke M.D.   On: 05/23/2023 13:37   MR CERVICAL SPINE WO CONTRAST  Result Date: 05/18/2023 CLINICAL DATA:  Myelopathy, acute, cervical spine. Cervical radiculopathy. EXAM: MRI CERVICAL SPINE WITHOUT CONTRAST TECHNIQUE: Multiplanar, multisequence MR imaging of the cervical spine was performed. No intravenous contrast was administered. COMPARISON:  Cervical spine radiographs 05/17/2023. FINDINGS: Alignment: Normal. Vertebrae: Normal vertebral body heights and marrow signal. Cord: Normal spinal cord signal. Posterior Fossa, vertebral arteries, paraspinal tissues: Unremarkable. Disc levels: C2-C3:  Normal. C3-C4: No disc herniation, spinal canal stenosis or neural foraminal narrowing. Mild right facet arthropathy. C4-C5: Left eccentric disc bulge results in moderate spinal canal stenosis. Facet arthropathy and uncovertebral joint spurring results in severe bilateral neural foraminal narrowing. C5-C6: Disc bulge and ossification of the posterior longitudinal ligament results in severe spinal canal stenosis with moderate compression of the spinal cord. Left-greater-than-right facet arthropathy and uncovertebral joint spurring results in severe left and moderate right neural foraminal narrowing. C6-C7: Disc bulge and ossification of the posterior longitudinal  ligament results moderate-to-severe spinal canal stenosis with moderate deformity of the left hemicord. No significant neural foraminal narrowing. C7-T1:  Normal. IMPRESSION: 1. 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 2. Moderate spinal canal stenosis at C4-C5. 3. Severe bilateral neural foraminal narrowing at C4-C5. Severe left and moderate right neural foraminal narrowing at C5-C6. Electronically Signed   By: Orvan Falconer M.D.   On: 05/18/2023 09:12    Antibiotics:  Anti-infectives (From admission, onward)    Start     Dose/Rate Route Frequency Ordered Stop   05/30/23 0400  vancomycin (VANCOREADY) IVPB 1250 mg/250 mL        1,250 mg 166.7 mL/hr over 90 Minutes Intravenous  Once 05/29/23 2048 05/30/23 0629   05/29/23 1600  vancomycin (VANCOREADY) IVPB 1500 mg/300 mL        1,500 mg 150 mL/hr over 120 Minutes Intravenous On call to O.R. 05/29/23 1413 05/29/23 1809       Discharge Exam: Blood pressure 121/79, pulse 75, temperature 98.1 F (36.7 C), temperature source Oral, resp. rate 20, height 5\' 6"  (1.676 m), weight 113.4 kg, SpO2 94 %. Neurologic: Grossly normal Ambulating and voiding well incision cdi   Discharge Medications:   Allergies as of 05/30/2023       Reactions   Penicillins Anaphylaxis, Other (See Comments)   Unknown   Aspirin Nausea And Vomiting   Aimovig [erenumab-aooe]    Pyridium [phenazopyridine] Rash        Medication List     STOP taking these medications    meloxicam 15 MG tablet Commonly known as: MOBIC       TAKE these medications    atomoxetine 40 MG capsule Commonly known as: Strattera Take 1 capsule (40 mg total) by mouth daily.   cyclobenzaprine 5 MG tablet Commonly known as: FLEXERIL Take 1 tablet (5 mg total) by mouth every 8 (eight) hours as needed.  Gabapentin (Once-Daily) 300 MG Tabs Take 1 tablet  (300 mg total) by mouth in the morning, at noon, and at bedtime.   gabapentin 100 MG capsule Commonly known as: NEURONTIN Take 100 mg by mouth daily as needed (pain).   oxyCODONE-acetaminophen 5-325 MG tablet Commonly known as: Percocet Take 1 tablet by mouth every 4 (four) hours as needed for severe pain.   predniSONE 20 MG tablet Commonly known as: DELTASONE Take 2 tablets (40 mg total) by mouth daily.   propranolol 20 MG tablet Commonly known as: INDERAL Take 0.5 tablets (10 mg total) by mouth 2 (two) times daily.   SUMAtriptan 100 MG tablet Commonly known as: Imitrex Take 1 tablet (100 mg total) by mouth every 2 (two) hours as needed for migraine. May repeat in 2 hours if headache persists or recurs.   topiramate 50 MG tablet Commonly known as: TOPAMAX Take 1 tablet (50 mg total) by mouth 2 (two) times daily.   Vitamin D 50 MCG (2000 UT) Caps Take 2,000 Units by mouth daily.        Disposition: home   Final Dx: acdf C4-5, C5-6, C6-7  Discharge Instructions      Remove dressing in 72 hours   Complete by: As directed    Call MD for:  difficulty breathing, headache or visual disturbances   Complete by: As directed    Call MD for:  hives   Complete by: As directed    Call MD for:  persistant nausea and vomiting   Complete by: As directed    Call MD for:  redness, tenderness, or signs of infection (pain, swelling, redness, odor or green/yellow discharge around incision site)   Complete by: As directed    Call MD for:  severe uncontrolled pain   Complete by: As directed    Call MD for:  temperature >100.4   Complete by: As directed    Diet - low sodium heart healthy   Complete by: As directed    Driving Restrictions   Complete by: As directed    No driving for 2 weeks, no riding in the car for 1 week   Increase activity slowly   Complete by: As directed           Signed: Tiana Loft Mikenzie Mccannon 05/30/2023, 7:37 AM

## 2023-06-01 ENCOUNTER — Encounter: Payer: Self-pay | Admitting: *Deleted

## 2023-06-01 ENCOUNTER — Telehealth: Payer: Self-pay | Admitting: *Deleted

## 2023-06-01 NOTE — Transitions of Care (Post Inpatient/ED Visit) (Signed)
06/01/2023  Name: Jeanette Alvarado MRN: [id number] DOB: [birth year]  Today's TOC FU Call Status: Today's TOC FU Call Status:: Successful TOC FU Call Competed TOC FU Call Complete Date: 06/01/23  Transition Care Management Follow-up Telephone Call Date of Discharge: 05/30/23 Discharge Facility: [facility]) Type of Discharge: Inpatient Admission Primary Inpatient Discharge Diagnosis:: AICD-Fusion How have you been since you were released from the hospital?: Same ("I seem to be doing okay after the surgery but I am in a lot of pain and about to run out of pain medication- he only gave me a couple days worth of pain meds.  Other than that, everything is overall fine.  I will call the surgeon as you have advised") Any questions or concerns?: Yes Patient Questions/Concerns:: significant post-op pain with limited amount of pain medication Patient Questions/Concerns Addressed: Other: (advised patient to contact surgical provider to address pain and need for additional pain medications; confirmed she has surgeon's office contact information and advised her to call after TOC call, due to upcoming weekend to avoid possible ED visit)  Items Reviewed: Did you receive and understand the discharge instructions provided?: Yes (thoroughly reviewed with patient who verbalizes good understanding of same) Medications obtained,verified, and reconciled?: Yes (Medications Reviewed) (Full medication reconciliation/ review completed; no concerns or discrepancies identified; confirmed patient obtained/ is taking all newly Rx'd medications as instructed; self-manages medications and denies questions/ concerns around medications today) Any new allergies since your discharge?: No Dietary orders reviewed?: Yes Type of Diet Ordered:: "soft; I can hardly swallow after this surgery I just had" Do you have support at home?: Yes People in Home: alone Name of Support/Comfort Primary Source: Reports normally resides with  her adult son/ independent in self-care activities at baseline; currently temporarily residing with daughter post-recent surgery; daughter assisting with care needs as/ if needed/ indicated  Medications Reviewed Today: Medications Reviewed Today     Reviewed by Michaela Corner, RN (Registered Nurse) on 06/01/23 at 1458  Med List Status: <None>   Medication Order Taking? Sig Documenting Provider Last Dose Status Informant  atomoxetine (STRATTERA) 40 MG capsule [id number] No Take 1 capsule (40 mg total) by mouth daily.  Patient not taking: Reported on 06/01/2023   Blane Ohara, MD Not Taking Active Self           Med Note Michaela Corner   Thu Jun 01, 2023  2:54 PM) 06/01/23: reports during TOC no longer taking: states, "we tried this but it didn't work- I am no longer taking"  Cholecalciferol (VITAMIN D) 50 MCG (2000 UT) CAPS [id number] Yes Take 2,000 Units by mouth daily. [provider] Taking Active Self  cyclobenzaprine (FLEXERIL) 5 MG tablet [id number] Yes Take 1 tablet (5 mg total) by mouth every 8 (eight) hours as needed. Meyran, Tiana Loft, NP Taking Active   gabapentin (NEURONTIN) 100 MG capsule [id number] Yes Take 100 mg by mouth daily as needed (pain). [provider] Taking Active Self           Med Note Michaela Corner   Thu Jun 01, 2023  2:55 PM) 06/01/23: reports during [facility] call has not taken/ needed recently  Gabapentin, Once-Daily, 300 MG TABS [id number] Yes Take 1 tablet (300 mg total) by mouth in the morning, at noon, and at bedtime. Cox, Kirsten, MD Taking Active Self  oxyCODONE-acetaminophen (PERCOCET) 5-325 MG tablet [id number] Yes Take 1 tablet by mouth every 4 (four) hours as needed for severe pain. Meyran, Tiana Loft, NP Taking Active  predniSONE (DELTASONE) 20 MG tablet [id number] No Take 2 tablets (40 mg total) by mouth daily.  Patient not taking: Reported on 06/01/2023   Blane Ohara, MD Not Taking Active Self           Med Note Michaela Corner    Thu Jun 01, 2023  2:56 PM) 06/01/23: Reports during [facility] call completed course as prescribed, no longer taking  propranolol (INDERAL) 20 MG tablet [id number] Yes Take 0.5 tablets (10 mg total) by mouth 2 (two) times daily. Cox, Kirsten, MD Taking Active Self  SUMAtriptan (IMITREX) 100 MG tablet [id number] Yes Take 1 tablet (100 mg total) by mouth every 2 (two) hours as needed for migraine. May repeat in 2 hours if headache persists or recurs. Abigail Miyamoto, MD Taking Active Self           Med Note Jonnie Kind Jun 01, 2023  2:57 PM) 06/01/23: reports during [facility] call has not needed recently  topiramate (TOPAMAX) 50 MG tablet [id number] No Take 1 tablet (50 mg total) by mouth 2 (two) times daily.  Patient not taking: Reported on 05/17/2023   Blane Ohara, MD Not Taking Active Self           Med Note Michaela Corner   Thu Jun 01, 2023  2:58 PM) 06/01/23: Reports during TOC call no longer takes this medication due to "bad side effects"            Home Care and Equipment/Supplies: Were Home Health Services Ordered?: No Any new equipment or medical supplies ordered?: No  Functional Questionnaire: Do you need assistance with bathing/showering or dressing?: Yes (daughter assisting as indicated after recent surgery) Do you need assistance with meal preparation?: Yes (daughter assisting as indicated after recent surgery) Do you need assistance with eating?: No Do you have difficulty maintaining continence: No Do you need assistance with getting out of bed/getting out of a chair/moving?: Yes (daughter assisting as indicated after recent surgery) Do you have difficulty managing or taking your medications?: No  Follow up appointments reviewed: PCP Follow-up appointment confirmed?: NA (verified not indicated per hospital discharging provider discharge notes) Specialist Hospital Follow-up appointment confirmed?: Yes Date of Specialist follow-up appointment?: 06/13/23 (verified this is  recommended time frame for follow up per hospital discharging provider notes) Follow-Up Specialty Provider:: neurosurgical provider Do you need transportation to your follow-up appointment?: No Do you understand care options if your condition(s) worsen?: Yes-patient verbalized understanding  SDOH Interventions Today    Flowsheet Row Most Recent Value  SDOH Interventions   Food Insecurity Interventions Intervention Not Indicated  [when questioned about food insecurity as documented on 03/26/23, patient stated, "I have no idea what that is all about- I have always had plenty of food,  I don't know who put that in my chart"]  Transportation Interventions Intervention Not Indicated  [normally drives self,  daughter assisting with transportation after recent surgery]      TOC Interventions Today    Flowsheet Row Most Recent Value  TOC Interventions   TOC Interventions Discussed/Reviewed TOC Interventions Discussed, Post op wound/incision care, S/S of infection  [Patient declines need for ongoing/ further care coordination outreach,  no care coordination needs identified at time of TOC call today,  provided my direct contact information should questions/ concerns/ needs arise post-TOC call]      Interventions Today    Flowsheet Row Most Recent Value  Chronic Disease   Chronic disease during today's visit Other  [AICD-  fusion]  General Interventions   General Interventions Discussed/Reviewed General Interventions Discussed, Doctor Visits, Durable Medical Equipment (DME)  Doctor Visits Discussed/Reviewed Specialist, Doctor Visits Discussed  Durable Medical Equipment (DME) Other  [confirmed does not currently require/ use assistive devices]  PCP/Specialist Visits Compliance with follow-up visit  Education Interventions   Education Provided Provided Education  Provided Verbal Education On Medication  [use of narcotics in post-op setting]  Nutrition Interventions   Nutrition  Discussed/Reviewed Nutrition Discussed  Pharmacy Interventions   Pharmacy Dicussed/Reviewed Pharmacy Topics Discussed  [Full medication review with updating medication list in EHR per patient report]  Safety Interventions   Safety Discussed/Reviewed Safety Discussed      Caryl Pina, RN, BSN, CCRN Alumnus RN CM Care Coordination/ Transition of Care- [facility] Care Management [phone]: direct office

## 2023-06-19 ENCOUNTER — Ambulatory Visit: Payer: BC Managed Care – PPO

## 2023-06-19 DIAGNOSIS — Z23 Encounter for immunization: Secondary | ICD-10-CM

## 2023-06-19 NOTE — Progress Notes (Signed)
Jeanette Alvarado comes in for her 2nd shingrix vaccine.  She tolerated the first injection with mild malaise.

## 2023-07-16 ENCOUNTER — Other Ambulatory Visit: Payer: Self-pay | Admitting: Family Medicine

## 2023-07-19 ENCOUNTER — Ambulatory Visit (INDEPENDENT_AMBULATORY_CARE_PROVIDER_SITE_OTHER): Payer: BC Managed Care – PPO | Admitting: Family Medicine

## 2023-07-19 VITALS — BP 110/76 | HR 88 | Temp 97.3°F | Resp 18 | Ht 66.0 in | Wt 242.0 lb

## 2023-07-19 DIAGNOSIS — Z6839 Body mass index (BMI) 39.0-39.9, adult: Secondary | ICD-10-CM

## 2023-07-19 DIAGNOSIS — G43009 Migraine without aura, not intractable, without status migrainosus: Secondary | ICD-10-CM

## 2023-07-19 DIAGNOSIS — F900 Attention-deficit hyperactivity disorder, predominantly inattentive type: Secondary | ICD-10-CM

## 2023-07-19 DIAGNOSIS — F331 Major depressive disorder, recurrent, moderate: Secondary | ICD-10-CM | POA: Diagnosis not present

## 2023-07-19 DIAGNOSIS — Z981 Arthrodesis status: Secondary | ICD-10-CM | POA: Diagnosis not present

## 2023-07-19 DIAGNOSIS — I4719 Other supraventricular tachycardia: Secondary | ICD-10-CM

## 2023-07-19 DIAGNOSIS — I7121 Aneurysm of the ascending aorta, without rupture: Secondary | ICD-10-CM

## 2023-07-19 MED ORDER — DESVENLAFAXINE SUCCINATE ER 50 MG PO TB24
50.0000 mg | ORAL_TABLET | Freq: Every day | ORAL | 0 refills | Status: DC
Start: 2023-07-19 — End: 2023-10-14

## 2023-07-19 NOTE — Progress Notes (Signed)
Subjective:  Patient ID: Jeanette Alvarado, female    DOB: [birth year]  Age: 52 y.o. MRN: [id number]  Chief Complaint  Patient presents with   Chronic follow-up of medical issues    HPI ADD: stopped strattera because it was ineffective.    Hyperlipidemia: no medication  PSVT: Has occasional increase in heart rate but when she was taking diltiazem her heart drop was too low.   S/P cervical spinal fusion.  She is 6 weeks post-op.  Her recovery is going very well but she does have limited ROM left arm.    Depression:  she has been recovering from cervical surgery over the last 6 weeks.  She has been very limited in her activities which has been very stressful since she usually works 13 hour days.  She want to restart pristiq which she has done very well with in the past.  Patient has history of depression and a suicide attempt in her 2s.  She denies any desire to kill herself.        07/19/2023    8:46 AM 03/27/2023    9:12 AM 05/07/2021    8:13 AM 05/07/2021    8:00 AM 01/20/2021    9:40 AM  Depression screen PHQ 2/9  Decreased Interest 3 0 1 0 1  Down, Depressed, Hopeless 3 1 0 0 1  PHQ - 2 Score 6 1 1  0 2  Altered sleeping 2 3 0  0  Tired, decreased energy 3 3 1  1   Change in appetite 0 0 0  0  Feeling bad or failure about yourself  2 1 0  0  Trouble concentrating 3 3 1  3   Moving slowly or fidgety/restless 1 3 0  2  Suicidal thoughts 0 0 0  0  PHQ-9 Score 17 14 3  8   Difficult doing work/chores Very difficult Somewhat difficult Somewhat difficult  Somewhat difficult        07/19/2023    8:45 AM  Fall Risk   Falls in the past year? 0  Number falls in past yr: 0  Injury with Fall? 0  Risk for fall due to : No Fall Risks  Follow up Falls evaluation completed;Follow up appointment    Patient Care Team: Blane Ohara, MD as PCP - General (Internal Medicine) Thomasene Ripple, DO as PCP - Cardiology (Cardiology)   Review of Systems  Constitutional:  Positive for fatigue.  Negative for chills and fever.  HENT:  Positive for voice change. Negative for congestion, rhinorrhea and sore throat.   Respiratory:  Negative for cough and shortness of breath.   Cardiovascular:  Negative for chest pain.  Gastrointestinal:  Negative for abdominal pain, constipation, diarrhea, nausea and vomiting.  Genitourinary:  Negative for dysuria and urgency.  Musculoskeletal:  Negative for back pain and myalgias.  Neurological:  Positive for headaches. Negative for dizziness, weakness and light-headedness.  Psychiatric/Behavioral:  Positive for dysphoric mood. The patient is nervous/anxious.       07/19/2023    8:46 AM 03/27/2023    9:12 AM 05/07/2021    8:13 AM 05/07/2021    8:00 AM 01/20/2021    9:40 AM  Depression screen PHQ 2/9  Decreased Interest 3 0 1 0 1  Down, Depressed, Hopeless 3 1 0 0 1  PHQ - 2 Score 6 1 1  0 2  Altered sleeping 2 3 0  0  Tired, decreased energy 3 3 1  1   Change in appetite 0 0 0  0  Feeling bad or failure about yourself  2 1 0  0  Trouble concentrating 3 3 1  3   Moving slowly or fidgety/restless 1 3 0  2  Suicidal thoughts 0 0 0  0  PHQ-9 Score 17 14 3  8   Difficult doing work/chores Very difficult Somewhat difficult Somewhat difficult  Somewhat difficult       07/19/2023    8:46 AM  GAD 7 : Generalized Anxiety Score  Nervous, Anxious, on Edge 3  Control/stop worrying 3  Worry too much - different things 1  Trouble relaxing 2  Restless 2  Easily annoyed or irritable 1  Afraid - awful might happen 2  Total GAD 7 Score 14  Anxiety Difficulty Very difficult       Current Outpatient Medications on File Prior to Visit  Medication Sig Dispense Refill   Cholecalciferol (VITAMIN D) 50 MCG (2000 UT) CAPS Take 2,000 Units by mouth daily.     cyclobenzaprine (FLEXERIL) 5 MG tablet Take 1 tablet (5 mg total) by mouth every 8 (eight) hours as needed. 90 tablet 0   gabapentin (NEURONTIN) 100 MG capsule Take 100 mg by mouth daily as needed (pain).      gabapentin (NEURONTIN) 300 MG capsule TAKE 1 CAPSULE BY MOUTH EVERY MORNING, AT NOON, AND AT BEDTIME 90 capsule 3   propranolol (INDERAL) 20 MG tablet Take 0.5 tablets (10 mg total) by mouth 2 (two) times daily. 60 tablet 2   SUMAtriptan (IMITREX) 100 MG tablet Take 1 tablet (100 mg total) by mouth every 2 (two) hours as needed for migraine. May repeat in 2 hours if headache persists or recurs. 10 tablet 5   [DISCONTINUED] Gabapentin, Once-Daily, 300 MG TABS Take 1 tablet (300 mg total) by mouth in the morning, at noon, and at bedtime. 90 tablet 1   No current facility-administered medications on file prior to visit.   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   Found on CT scan 2022   Ascending aortic aneurysm (HCC) 04/20/2021   BMI 36.0-36.9,adult 07/09/2020   Chest pain 04/08/2021   Family history of premature CAD 04/20/2021   Fatigue 06/01/2021   GERD (gastroesophageal reflux disease) 04/08/2021   Major depressive disorder, recurrent, moderate (HCC)    Migraine without aura, not intractable, without status migrainosus    NSVT (nonsustained ventricular tachycardia) (HCC) 06/01/2021   Palpitations 04/20/2021   PAT (paroxysmal atrial tachycardia) 06/01/2021   PVC's (premature ventricular contractions) 04/20/2021   Shortness of breath 06/01/2021   Shoulder pain, left 01/20/2021   Tobacco use 04/20/2021   Past Surgical History:  Procedure Laterality Date   ANTERIOR CERVICAL DECOMP/DISCECTOMY FUSION N/A 05/29/2023   Procedure: Anterior Cervical Decompression Fusion - Cervical four-Cervical five - Cervical five-Cervical six - Cervical six-Cervical seven;  Surgeon: Arman Bogus, MD;  [facility]: [facility] OR;  Service: Neurosurgery;  Laterality: N/A;   DILATION AND CURETTAGE OF UTERUS     multiple miscarriages   exploratory laparotomy multiple times for endometriosis     KNEE ARTHROSCOPY Right    TONSILLECTOMY     TUBAL LIGATION      VAGINAL HYSTERECTOMY      Family History  Problem Relation Age of Onset   Breast cancer Mother    Hypothyroidism Mother    Hypertension Father    Hyperlipidemia Father    CVA Father    Social History   Socioeconomic History  Marital status: Divorced    Spouse name: Not on file   Number of children: 2   Years of education: Not on file   Highest education level: Associate degree: occupational, Scientist, product/process development, or vocational program  Occupational History   Not on file  Tobacco Use   Smoking status: Former    Current packs/day: 0.00    Average packs/day: 1.5 packs/day for 33.0 years (49.5 ttl pk-yrs)    Types: Cigarettes    Start date: 12/10/1988    Quit date: 12/10/2021    Years since quitting: 1.6   Smokeless tobacco: Never  Vaping Use   Vaping status: Former   Quit date: 03/13/2023  Substance and Sexual Activity   Alcohol use: Not Currently   Drug use: Not Currently   Sexual activity: Not Currently  Other Topics Concern   Not on file  Social History Narrative   Right handed   Social Determinants of Health   Financial Resource Strain: Low Risk  (03/26/2023)   Overall Financial Resource Strain (CARDIA)    Difficulty of Paying Living Expenses: Not very hard  Food Insecurity: No Food Insecurity (06/01/2023)   Hunger Vital Sign    Worried About Running Out of Food in the Last Year: Never true    Ran Out of Food in the Last Year: Never true  Recent Concern: Food Insecurity - Food Insecurity Present (03/26/2023)   Hunger Vital Sign    Worried About Running Out of Food in the Last Year: Sometimes true    Ran Out of Food in the Last Year: Never true  Transportation Needs: No Transportation Needs (06/01/2023)   PRAPARE - Administrator, Civil Service (Medical): No    Lack of Transportation (Non-Medical): No  Physical Activity: Sufficiently Active (03/26/2023)   Exercise Vital Sign    Days of Exercise per Week: 4 days    Minutes of Exercise per Session: 90 min   Stress: Stress Concern Present (03/26/2023)   [employer] of Occupational Health - Occupational Stress Questionnaire    Feeling of Stress : Rather much  Social Connections: Socially Isolated (03/26/2023)   Social Connection and Isolation Panel [NHANES]    Frequency of Communication with Friends and Family: Three times a week    Frequency of Social Gatherings with Friends and Family: Once a week    Attends Religious Services: Never    Diplomatic Services operational officer: No    Attends Engineer, structural: Not on file    Marital Status: Divorced    Objective:  BP 110/76   Pulse 88   Temp (!) 97.3 F (36.3 C)   Resp 18   Ht 5\' 6"  (1.676 m)   Wt 242 lb (109.8 kg)   BMI 39.06 kg/m      07/19/2023    8:38 AM 05/30/2023    7:56 AM 05/30/2023    5:00 AM  BP/Weight  Systolic BP 110 116 121  Diastolic BP 76 77 79  Wt. (Lbs) 242    BMI 39.06 kg/m2      Physical Exam Vitals reviewed.  Constitutional:      Appearance: Normal appearance. She is normal weight.  Neck:     Vascular: No carotid bruit.  Cardiovascular:     Rate and Rhythm: Normal rate and regular rhythm.     Heart sounds: Normal heart sounds.  Pulmonary:     Effort: Pulmonary effort is normal. No respiratory distress.     Breath sounds: Normal breath sounds.  Abdominal:     General: Abdomen is flat. Bowel sounds are normal.     Palpations: Abdomen is soft.     Tenderness: There is no abdominal tenderness.  Neurological:     Mental Status: She is alert and oriented to person, place, and time.     Comments: Limited ROM left arm.   Psychiatric:        Mood and Affect: Mood normal.        Behavior: Behavior normal.     Diabetic Foot Exam - Simple   No data filed      Lab Results  Component Value Date   WBC 14.9 (H) 05/26/2023   HGB 15.2 (H) 05/26/2023   HCT 45.5 05/26/2023   PLT 417 (H) 05/26/2023   GLUCOSE 88 05/26/2023   CHOL 201 (H) 03/27/2023   TRIG 112 03/27/2023   HDL 53  03/27/2023   LDLCALC 128 (H) 03/27/2023   ALT 15 03/27/2023   AST 18 03/27/2023   NA 133 (L) 05/26/2023   K 3.8 05/26/2023   CL 102 05/26/2023   CREATININE 0.87 05/26/2023   BUN 18 05/26/2023   CO2 21 (L) 05/26/2023   TSH 1.530 03/27/2023   INR 1.0 05/26/2023      Assessment & Plan:    Migraine without aura, not intractable, without status migrainosus Assessment & Plan: Well-controlled. Improved since surgery. Continue Inderal.  Has Imitrex as needed   Major depressive disorder, recurrent, moderate (HCC) Assessment & Plan: Start Pristiq 50 mg daily in AM.  Orders: -     Desvenlafaxine Succinate ER; Take 1 tablet (50 mg total) by mouth daily.  Dispense: 90 tablet; Refill: 0  PAT (paroxysmal atrial tachycardia) Assessment & Plan: Well-controlled.  Continue propranolol at current dose.   S/P cervical spinal fusion Assessment & Plan: Improving.  Working on range of motion.  She has discontinued several medicines which were not helping.  I did discuss possibly calling Dr. Yetta Barre if working her limited 8-hour shifts which she started yesterday was too much.   Attention deficit hyperactivity disorder (ADHD), predominantly inattentive type Assessment & Plan: Recommend no medication at this time.   Class 2 severe obesity due to excess calories with serious comorbidity and body mass index (BMI) of 39.0 to 39.9 in adult [facility]) Assessment & Plan: Continue to work on healthy diet and exercise.     Aneurysm of ascending aorta without rupture [facility]) Assessment & Plan: Repeat CTA of her chest is recommended Confirm with scheduled visit..      Meds ordered this encounter  Medications   desvenlafaxine (PRISTIQ) 50 MG 24 hr tablet    Sig: Take 1 tablet (50 mg total) by mouth daily.    Dispense:  90 tablet    Refill:  0    No orders of the defined types were placed in this encounter.    Follow-up: Return in about 3 months (around 10/19/2023) for chronic follow  up.   I,Carolyn M Morrison,acting as a Neurosurgeon for Blane Ohara, MD.,have documented all relevant documentation on the behalf of Blane Ohara, MD,as directed by  Blane Ohara, MD while in the presence of Blane Ohara, MD.   An After Visit Summary was printed and given to the patient.  Blane Ohara, MD [facility] [phone]

## 2023-07-19 NOTE — Patient Instructions (Signed)
Restart pristiq 50 mg daily. Call back in 3 weeks to adjust dose if needed.    Byrd Hesselbach, after you left I realized that you have a history of and a ascending aortic aneurysm.  This is dilation of the aorta in your chest cavity.  This was found in 2022.  It was recommended to have a CTA of your chest annually.  Please let me know if we may go ahead and schedule this.  Thank you

## 2023-07-20 ENCOUNTER — Encounter: Payer: Self-pay | Admitting: Family Medicine

## 2023-07-20 NOTE — Assessment & Plan Note (Signed)
Recommend no medication at this time.

## 2023-07-20 NOTE — Assessment & Plan Note (Signed)
Well-controlled. Improved since surgery. Continue Inderal.  Has Imitrex as needed

## 2023-07-20 NOTE — Assessment & Plan Note (Signed)
Improving.  Working on range of motion.  She has discontinued several medicines which were not helping.  I did discuss possibly calling Dr. Yetta Barre if working her limited 8-hour shifts which she started yesterday was too much.

## 2023-07-20 NOTE — Assessment & Plan Note (Signed)
Repeat CTA of her chest is recommended Confirm with scheduled visit.Marland Kitchen

## 2023-07-20 NOTE — Assessment & Plan Note (Signed)
Start Pristiq 50 mg daily in AM.

## 2023-07-20 NOTE — Assessment & Plan Note (Signed)
Continue to work on healthy diet and exercise.   

## 2023-07-20 NOTE — Assessment & Plan Note (Signed)
>>  ASSESSMENT AND PLAN FOR MAJOR DEPRESSIVE DISORDER, RECURRENT, MODERATE (HCC) WRITTEN ON 07/20/2023 11:24 PM BY Alam Guterrez, MD  Start Pristiq 50 mg daily in AM.

## 2023-07-20 NOTE — Assessment & Plan Note (Signed)
Well-controlled.  Continue propranolol at current dose.

## 2023-09-14 DIAGNOSIS — G959 Disease of spinal cord, unspecified: Secondary | ICD-10-CM | POA: Diagnosis not present

## 2023-09-14 DIAGNOSIS — M25512 Pain in left shoulder: Secondary | ICD-10-CM | POA: Diagnosis not present

## 2023-09-14 DIAGNOSIS — G8929 Other chronic pain: Secondary | ICD-10-CM | POA: Diagnosis not present

## 2023-09-18 DIAGNOSIS — M79673 Pain in unspecified foot: Secondary | ICD-10-CM

## 2023-09-28 DIAGNOSIS — M25512 Pain in left shoulder: Secondary | ICD-10-CM | POA: Diagnosis not present

## 2023-09-28 DIAGNOSIS — M7582 Other shoulder lesions, left shoulder: Secondary | ICD-10-CM | POA: Diagnosis not present

## 2023-10-12 DIAGNOSIS — M25512 Pain in left shoulder: Secondary | ICD-10-CM | POA: Diagnosis not present

## 2023-10-14 ENCOUNTER — Other Ambulatory Visit: Payer: Self-pay | Admitting: Family Medicine

## 2023-10-14 DIAGNOSIS — F331 Major depressive disorder, recurrent, moderate: Secondary | ICD-10-CM

## 2023-10-16 DIAGNOSIS — M7502 Adhesive capsulitis of left shoulder: Secondary | ICD-10-CM | POA: Diagnosis not present

## 2023-10-24 ENCOUNTER — Encounter: Payer: Self-pay | Admitting: Family Medicine

## 2023-10-24 NOTE — Assessment & Plan Note (Signed)
Well-controlled. Improved since surgery. Continue Inderal.  Has Imitrex as needed

## 2023-10-24 NOTE — Assessment & Plan Note (Signed)
Stable. Continue to avoid caffeine.

## 2023-10-24 NOTE — Progress Notes (Unsigned)
Subjective:  Patient ID: Jeanette Alvarado, female    DOB: February 20, 1971  Age: 52 y.o. MRN: [id number]  No chief complaint on file.   HPI   ADD: stopped strattera because it was ineffective.    Hyperlipidemia: no medication  PSVT: Taking Propranolol 20 mg 1/2 tablet twice a day.  Depression:  She is taking Pristiq 50 mg daily     07/19/2023    8:46 AM 03/27/2023    9:12 AM 05/07/2021    8:13 AM 05/07/2021    8:00 AM 01/20/2021    9:40 AM  Depression screen PHQ 2/9  Decreased Interest 3 0 1 0 1  Down, Depressed, Hopeless 3 1 0 0 1  PHQ - 2 Score 6 1 1  0 2  Altered sleeping 2 3 0  0  Tired, decreased energy 3 3 1  1   Change in appetite 0 0 0  0  Feeling bad or failure about yourself  2 1 0  0  Trouble concentrating 3 3 1  3   Moving slowly or fidgety/restless 1 3 0  2  Suicidal thoughts 0 0 0  0  PHQ-9 Score 17 14 3  8   Difficult doing work/chores Very difficult Somewhat difficult Somewhat difficult  Somewhat difficult        07/19/2023    8:45 AM  Fall Risk   Falls in the past year? 0  Number falls in past yr: 0  Injury with Fall? 0  Risk for fall due to : No Fall Risks  Follow up Falls evaluation completed;Follow up appointment    Patient Care Team: Blane Ohara, MD as PCP - General (Internal Medicine) Thomasene Ripple, DO as PCP - Cardiology (Cardiology)   Review of Systems  Current Outpatient Medications on File Prior to Visit  Medication Sig Dispense Refill   Cholecalciferol (VITAMIN D) 50 MCG (2000 UT) CAPS Take 2,000 Units by mouth daily.     cyclobenzaprine (FLEXERIL) 5 MG tablet Take 1 tablet (5 mg total) by mouth every 8 (eight) hours as needed. 90 tablet 0   desvenlafaxine (PRISTIQ) 50 MG 24 hr tablet TAKE 1 TABLET(50 MG) BY MOUTH DAILY 90 tablet 0   gabapentin (NEURONTIN) 100 MG capsule Take 100 mg by mouth daily as needed (pain).     gabapentin (NEURONTIN) 300 MG capsule TAKE 1 CAPSULE BY MOUTH EVERY MORNING, AT NOON, AND AT BEDTIME 90 capsule 3   propranolol  (INDERAL) 20 MG tablet Take 0.5 tablets (10 mg total) by mouth 2 (two) times daily. 60 tablet 2   SUMAtriptan (IMITREX) 100 MG tablet Take 1 tablet (100 mg total) by mouth every 2 (two) hours as needed for migraine. May repeat in 2 hours if headache persists or recurs. 10 tablet 5   [DISCONTINUED] Gabapentin, Once-Daily, 300 MG TABS Take 1 tablet (300 mg total) by mouth in the morning, at noon, and at bedtime. 90 tablet 1   No current facility-administered medications on file prior to visit.   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   Found on CT scan 2022   Ascending aortic aneurysm (HCC) 04/20/2021   BMI 36.0-36.9,adult 07/09/2020   Chest pain 04/08/2021   Family history of premature CAD 04/20/2021   Fatigue 06/01/2021   GERD (gastroesophageal reflux disease) 04/08/2021   Major depressive disorder, recurrent, moderate (HCC)    Migraine without aura, not intractable, without status migrainosus    NSVT (nonsustained  ventricular tachycardia) (HCC) 06/01/2021   Palpitations 04/20/2021   PAT (paroxysmal atrial tachycardia) (HCC) 06/01/2021   PVC's (premature ventricular contractions) 04/20/2021   Rash of foot 04/05/2022   Shortness of breath 06/01/2021   Shoulder pain, left 01/20/2021   Tobacco use 04/20/2021   Past Surgical History:  Procedure Laterality Date   ANTERIOR CERVICAL DECOMP/DISCECTOMY FUSION N/A 05/29/2023   Procedure: Anterior Cervical Decompression Fusion - Cervical four-Cervical five - Cervical five-Cervical six - Cervical six-Cervical seven;  Surgeon: Arman Bogus, MD;  Location: [facility] OR;  Service: Neurosurgery;  Laterality: N/A;   DILATION AND CURETTAGE OF UTERUS     multiple miscarriages   exploratory laparotomy multiple times for endometriosis     KNEE ARTHROSCOPY Right    TONSILLECTOMY     TUBAL LIGATION     VAGINAL HYSTERECTOMY      Family History  Problem Relation Age of Onset   Breast cancer  Mother    Hypothyroidism Mother    Hypertension Father    Hyperlipidemia Father    CVA Father    Social History   Socioeconomic History   Marital status: Divorced    Spouse name: Not on file   Number of children: 2   Years of education: Not on file   Highest education level: Associate degree: occupational, Scientist, product/process development, or vocational program  Occupational History   Not on file  Tobacco Use   Smoking status: Former    Current packs/day: 0.00    Average packs/day: 1.5 packs/day for 33.0 years (49.5 ttl pk-yrs)    Types: Cigarettes    Start date: 12/10/1988    Quit date: 12/10/2021    Years since quitting: 1.8   Smokeless tobacco: Never  Vaping Use   Vaping status: Former   Quit date: 03/13/2023  Substance and Sexual Activity   Alcohol use: Not Currently   Drug use: Not Currently   Sexual activity: Not Currently  Other Topics Concern   Not on file  Social History Narrative   Right handed   Social Determinants of Health   Financial Resource Strain: Low Risk  (03/26/2023)   Overall Financial Resource Strain (CARDIA)    Difficulty of Paying Living Expenses: Not very hard  Food Insecurity: No Food Insecurity (06/01/2023)   Hunger Vital Sign    Worried About Running Out of Food in the Last Year: Never true    Ran Out of Food in the Last Year: Never true  Recent Concern: Food Insecurity - Food Insecurity Present (03/26/2023)   Hunger Vital Sign    Worried About Running Out of Food in the Last Year: Sometimes true    Ran Out of Food in the Last Year: Never true  Transportation Needs: No Transportation Needs (06/01/2023)   PRAPARE - Administrator, Civil Service (Medical): No    Lack of Transportation (Non-Medical): No  Physical Activity: Sufficiently Active (03/26/2023)   Exercise Vital Sign    Days of Exercise per Week: 4 days    Minutes of Exercise per Session: 90 min  Stress: Stress Concern Present (03/26/2023)   [employer] of Occupational Health -  Occupational Stress Questionnaire    Feeling of Stress : Rather much  Social Connections: Socially Isolated (03/26/2023)   Social Connection and Isolation Panel [NHANES]    Frequency of Communication with Friends and Family: Three times a week    Frequency of Social Gatherings with Friends and Family: Once a week    Attends Religious Services: Never  Active Member of Clubs or Organizations: No    Attends Banker Meetings: Not on file    Marital Status: Divorced    Objective:  There were no vitals taken for this visit.     07/19/2023    8:38 AM 05/30/2023    7:56 AM 05/30/2023    5:00 AM  BP/Weight  Systolic BP 110 116 121  Diastolic BP 76 77 79  Wt. (Lbs) 242    BMI 39.06 kg/m2      Physical Exam  Diabetic Foot Exam - Simple   No data filed      Lab Results  Component Value Date   WBC 14.9 (H) 05/26/2023   HGB 15.2 (H) 05/26/2023   HCT 45.5 05/26/2023   PLT 417 (H) 05/26/2023   GLUCOSE 88 05/26/2023   CHOL 201 (H) 03/27/2023   TRIG 112 03/27/2023   HDL 53 03/27/2023   LDLCALC 128 (H) 03/27/2023   ALT 15 03/27/2023   AST 18 03/27/2023   NA 133 (L) 05/26/2023   K 3.8 05/26/2023   CL 102 05/26/2023   CREATININE 0.87 05/26/2023   BUN 18 05/26/2023   CO2 21 (L) 05/26/2023   TSH 1.530 03/27/2023   INR 1.0 05/26/2023      Assessment & Plan:    Paroxysmal SVT (supraventricular tachycardia) (HCC) Assessment & Plan: Stable. Continue propranolol. Continue to avoid caffeine.     Migraine without aura, not intractable, without status migrainosus Assessment & Plan: Well-controlled. Improved since surgery. Continue Inderal.  Has Imitrex as needed   Gastroesophageal reflux disease without esophagitis Assessment & Plan: Continue to work on weight loss.  Currently not on medications.   Mixed hyperlipidemia Assessment & Plan: Well controlled.  No medicines.  Continue to work on eating a healthy diet and exercise.  Labs drawn today.         No orders of the defined types were placed in this encounter.   No orders of the defined types were placed in this encounter.    Follow-up: No follow-ups on file.   I,Marla I Leal-Borjas,acting as a scribe for Blane Ohara, MD.,have documented all relevant documentation on the behalf of Blane Ohara, MD,as directed by  Blane Ohara, MD while in the presence of Blane Ohara, MD.   An After Visit Summary was printed and given to the patient.  Blane Ohara, MD [facility] [phone]

## 2023-10-24 NOTE — Assessment & Plan Note (Signed)
Well controlled.  No medicines.  Continue to work on eating a healthy diet and exercise.  Labs drawn today.   

## 2023-10-24 NOTE — Assessment & Plan Note (Signed)
Continue to work on weight loss.  Currently not on medications. ?

## 2023-10-25 ENCOUNTER — Ambulatory Visit: Payer: BC Managed Care – PPO | Admitting: Family Medicine

## 2023-10-25 ENCOUNTER — Encounter: Payer: Self-pay | Admitting: Family Medicine

## 2023-10-25 VITALS — BP 134/76 | HR 84 | Temp 98.2°F | Ht 66.0 in | Wt 244.0 lb

## 2023-10-25 DIAGNOSIS — I471 Supraventricular tachycardia, unspecified: Secondary | ICD-10-CM

## 2023-10-25 DIAGNOSIS — M7502 Adhesive capsulitis of left shoulder: Secondary | ICD-10-CM | POA: Diagnosis not present

## 2023-10-25 DIAGNOSIS — G43009 Migraine without aura, not intractable, without status migrainosus: Secondary | ICD-10-CM

## 2023-10-25 DIAGNOSIS — E782 Mixed hyperlipidemia: Secondary | ICD-10-CM

## 2023-10-25 DIAGNOSIS — M5412 Radiculopathy, cervical region: Secondary | ICD-10-CM

## 2023-10-25 DIAGNOSIS — K219 Gastro-esophageal reflux disease without esophagitis: Secondary | ICD-10-CM

## 2023-10-25 DIAGNOSIS — F332 Major depressive disorder, recurrent severe without psychotic features: Secondary | ICD-10-CM | POA: Diagnosis not present

## 2023-10-25 MED ORDER — PREDNISONE 50 MG PO TABS
50.0000 mg | ORAL_TABLET | Freq: Every day | ORAL | 0 refills | Status: DC
Start: 2023-10-25 — End: 2024-01-26

## 2023-10-25 MED ORDER — HYDROCODONE-ACETAMINOPHEN 5-325 MG PO TABS
1.0000 | ORAL_TABLET | Freq: Two times a day (BID) | ORAL | 0 refills | Status: DC | PRN
Start: 2023-10-25 — End: 2024-09-11

## 2023-10-25 MED ORDER — KETOROLAC TROMETHAMINE 60 MG/2ML IM SOLN
60.0000 mg | Freq: Once | INTRAMUSCULAR | Status: AC
Start: 2023-10-25 — End: 2023-10-25
  Administered 2023-10-25: 60 mg via INTRAMUSCULAR

## 2023-10-25 MED ORDER — DULOXETINE HCL 30 MG PO CPEP
ORAL_CAPSULE | ORAL | 0 refills | Status: DC
Start: 2023-10-25 — End: 2024-01-26

## 2023-10-25 NOTE — Patient Instructions (Addendum)
Start duloxetine 30 mg once daily at night x 1 week, then increase to 60 mg before bed   Start vicodin 5/325 mg one three times a day as needed for severe shoulder pain.   Gabapentin 600 mg at night, then try to gradually increase to 300 mg in am and 300 mg mid day, and then 600 mg before bed.   Prednisone 50 mg daily x 5 days.   Toradol shot given.

## 2023-10-26 ENCOUNTER — Encounter: Payer: Self-pay | Admitting: Family Medicine

## 2023-10-26 DIAGNOSIS — M7502 Adhesive capsulitis of left shoulder: Secondary | ICD-10-CM | POA: Insufficient documentation

## 2023-10-26 DIAGNOSIS — F332 Major depressive disorder, recurrent severe without psychotic features: Secondary | ICD-10-CM | POA: Insufficient documentation

## 2023-10-26 LAB — LIPID PANEL
Chol/HDL Ratio: 3.4 ratio (ref 0.0–4.4)
Cholesterol, Total: 209 mg/dL — ABNORMAL HIGH (ref 100–199)
HDL: 61 mg/dL (ref >39)
LDL Chol Calc (NIH): 132 mg/dL — ABNORMAL HIGH (ref 0–99)
Triglycerides: 92 mg/dL (ref 0–149)
VLDL Cholesterol Cal: 16 mg/dL (ref 5–40)

## 2023-10-26 LAB — CBC WITH DIFFERENTIAL/PLATELET
Basophils Absolute: 0 10*3/uL (ref 0.0–0.2)
Basos: 1 %
EOS (ABSOLUTE): 0.1 10*3/uL (ref 0.0–0.4)
Eos: 2 %
Hematocrit: 42.5 % (ref 34.0–46.6)
Hemoglobin: 14.3 g/dL (ref 11.1–15.9)
Immature Grans (Abs): 0 10*3/uL (ref 0.0–0.1)
Immature Granulocytes: 0 %
Lymphocytes Absolute: 2.1 10*3/uL (ref 0.7–3.1)
Lymphs: 32 %
MCH: 30.6 pg (ref 26.6–33.0)
MCHC: 33.6 g/dL (ref 31.5–35.7)
MCV: 91 fL (ref 79–97)
Monocytes Absolute: 0.4 10*3/uL (ref 0.1–0.9)
Monocytes: 6 %
Neutrophils Absolute: 3.9 10*3/uL (ref 1.4–7.0)
Neutrophils: 59 %
Platelets: 405 10*3/uL (ref 150–450)
RBC: 4.67 x10E6/uL (ref 3.77–5.28)
RDW: 12.8 % (ref 11.7–15.4)
WBC: 6.6 10*3/uL (ref 3.4–10.8)

## 2023-10-26 LAB — COMPREHENSIVE METABOLIC PANEL
ALT: 19 [IU]/L (ref 0–32)
AST: 18 [IU]/L (ref 0–40)
Albumin: 4.2 g/dL (ref 3.8–4.9)
Alkaline Phosphatase: 70 [IU]/L (ref 44–121)
BUN/Creatinine Ratio: 15 (ref 9–23)
BUN: 13 mg/dL (ref 6–24)
Bilirubin Total: 0.4 mg/dL (ref 0.0–1.2)
CO2: 19 mmol/L — ABNORMAL LOW (ref 20–29)
Calcium: 9.7 mg/dL (ref 8.7–10.2)
Chloride: 103 mmol/L (ref 96–106)
Creatinine, Ser: 0.84 mg/dL (ref 0.57–1.00)
Globulin, Total: 2.8 g/dL (ref 1.5–4.5)
Glucose: 88 mg/dL (ref 70–99)
Potassium: 4.4 mmol/L (ref 3.5–5.2)
Sodium: 140 mmol/L (ref 134–144)
Total Protein: 7 g/dL (ref 6.0–8.5)
eGFR: 84 mL/min/{1.73_m2} (ref >59)

## 2023-10-26 NOTE — Assessment & Plan Note (Signed)
Start duloxetine 30 mg once daily at night x 1 week, then increase to 60 mg before bed   Start vicodin 5/325 mg one three times a day as needed for severe shoulder pain.   Gabapentin 600 mg at night, then try to gradually increase to 300 mg in am and 300 mg mid day, and then 600 mg before bed.   Prednisone 50 mg daily x 5 days.   Toradol shot given.

## 2023-10-26 NOTE — Assessment & Plan Note (Signed)
Improved, but still having sporadic lancing pain.  Start duloxetine 30 mg once daily at night x 1 week, then increase to 60 mg before bed   Start vicodin 5/325 mg one three times a day as needed for severe shoulder pain.   Gabapentin 600 mg at night, then try to gradually increase to 300 mg in am and 300 mg mid day, and then 600 mg before bed.   Prednisone 50 mg daily x 5 days.   Toradol shot given.

## 2023-10-26 NOTE — Assessment & Plan Note (Signed)
Start duloxetine 30 mg once daily at night x 1 week, then increase to 60 mg before bed

## 2023-10-27 ENCOUNTER — Other Ambulatory Visit: Payer: Self-pay

## 2023-10-27 MED ORDER — ATORVASTATIN CALCIUM 10 MG PO TABS: 10.0000 mg | ORAL_TABLET | Freq: Every day | ORAL | 0 refills | Status: DC

## 2023-11-22 ENCOUNTER — Other Ambulatory Visit: Payer: Self-pay | Admitting: Family Medicine

## 2023-11-22 DIAGNOSIS — F332 Major depressive disorder, recurrent severe without psychotic features: Secondary | ICD-10-CM

## 2023-11-23 ENCOUNTER — Ambulatory Visit: Payer: BC Managed Care – PPO | Admitting: Family Medicine

## 2023-11-23 VITALS — BP 120/80 | HR 103 | Temp 97.6°F | Ht 66.0 in | Wt 247.0 lb

## 2023-11-23 DIAGNOSIS — G8929 Other chronic pain: Secondary | ICD-10-CM

## 2023-11-23 DIAGNOSIS — M25512 Pain in left shoulder: Secondary | ICD-10-CM | POA: Diagnosis not present

## 2023-11-23 DIAGNOSIS — I471 Supraventricular tachycardia, unspecified: Secondary | ICD-10-CM

## 2023-11-23 DIAGNOSIS — E782 Mixed hyperlipidemia: Secondary | ICD-10-CM | POA: Diagnosis not present

## 2023-11-23 DIAGNOSIS — F332 Major depressive disorder, recurrent severe without psychotic features: Secondary | ICD-10-CM | POA: Diagnosis not present

## 2023-11-23 DIAGNOSIS — R52 Pain, unspecified: Secondary | ICD-10-CM

## 2023-11-23 NOTE — Patient Instructions (Signed)
Stop cymbalta. Start trintellix once daily.

## 2023-11-23 NOTE — Progress Notes (Unsigned)
7  Subjective:  Patient ID: Jeanette Alvarado, female    DOB: 1970/12/23  Age: 52 y.o. MRN: [id number]  Chief Complaint  Patient presents with   Medical Management of Chronic Issues    HPI Patient is here for medication for follow up on duloxetine and vicodin. States medication has helped some however is unable to tolerate it at 60 mg dose which causes her feel like she is going to pass out/feels dizzy.   History of Present Illness The patient, with a history of depression and shoulder pain, presents with dizziness and lightheadedness. She reports that these symptoms are exacerbated by her current medication, to the point where she feels like she might pass out. Despite these side effects, she notes some improvement in her overall condition.  The patient also reports a recent incident where she felt something 'pop' in her shoulder, which has since led to an improvement in mobility and a decrease in pain. She is currently taking 600mg  of gabapentin twice a day, which has helped with the burning sensation down her arm. She also has a prescription for hydrocodone, but only uses it very rarely.  In terms of her mental health, the patient has tried several medications for depression, including Zoloft, Prozac, Benlafaxine, Wellbutrin, and Pristiq, but none have been effective or have caused unwanted side effects. She is currently seeking a new treatment option.     11/23/2023   10:45 AM 10/25/2023    9:46 AM 07/19/2023    8:46 AM 03/27/2023    9:12 AM 05/07/2021    8:13 AM  Depression screen PHQ 2/9  Decreased Interest 1 2 3  0 1  Down, Depressed, Hopeless 1 3 3 1  0  PHQ - 2 Score 2 5 6 1 1   Altered sleeping 3 2 2 3  0  Tired, decreased energy 3 2 3 3 1   Change in appetite 1 1 0 0 0  Feeling bad or failure about yourself  1 3 2 1  0  Trouble concentrating 3 3 3 3 1   Moving slowly or fidgety/restless 2 2 1 3  0  Suicidal thoughts 0 2 0 0 0  PHQ-9 Score 15 20 17 14 3   Difficult doing work/chores  Somewhat difficult Somewhat difficult Very difficult Somewhat difficult Somewhat difficult        07/19/2023    8:45 AM  Fall Risk   Falls in the past year? 0  Number falls in past yr: 0  Injury with Fall? 0  Risk for fall due to : No Fall Risks  Follow up Falls evaluation completed;Follow up appointment    Patient Care Team: Blane Ohara, MD as PCP - General (Internal Medicine) Thomasene Ripple, DO as PCP - Cardiology (Cardiology)   Review of Systems  Constitutional:  Negative for chills, fatigue and fever.  HENT:  Negative for congestion, ear pain, postnasal drip, rhinorrhea, sinus pressure, sinus pain and sore throat.   Respiratory:  Negative for cough and shortness of breath.   Cardiovascular:  Negative for chest pain.  Gastrointestinal:  Negative for diarrhea, nausea and vomiting.  Neurological:  Negative for dizziness and headaches.    Current Outpatient Medications on File Prior to Visit  Medication Sig Dispense Refill   atorvastatin (LIPITOR) 10 MG tablet Take 1 tablet (10 mg total) by mouth daily. 90 tablet 0   cyclobenzaprine (FLEXERIL) 5 MG tablet Take 1 tablet (5 mg total) by mouth every 8 (eight) hours as needed. 90 tablet 0   DULoxetine (CYMBALTA) 30 MG  capsule Take 1 capsule (30 mg total) by mouth daily for 7 days, THEN 2 capsules (60 mg total) daily for 23 days. 53 capsule 0   gabapentin (NEURONTIN) 300 MG capsule TAKE 1 CAPSULE BY MOUTH EVERY MORNING, AT NOON, AND AT BEDTIME 90 capsule 3   HYDROcodone-acetaminophen (NORCO/VICODIN) 5-325 MG tablet Take 1 tablet by mouth 2 (two) times daily as needed for severe pain (pain score 7-10) (OSteoarthritis of lumbar spine.). 60 tablet 0   predniSONE (DELTASONE) 50 MG tablet Take 1 tablet (50 mg total) by mouth daily with breakfast. 5 tablet 0   propranolol (INDERAL) 20 MG tablet Take 0.5 tablets (10 mg total) by mouth 2 (two) times daily. 60 tablet 2   SUMAtriptan (IMITREX) 100 MG tablet Take 1 tablet (100 mg total) by mouth  every 2 (two) hours as needed for migraine. May repeat in 2 hours if headache persists or recurs. 10 tablet 5   [DISCONTINUED] Gabapentin, Once-Daily, 300 MG TABS Take 1 tablet (300 mg total) by mouth in the morning, at noon, and at bedtime. 90 tablet 1   No current facility-administered medications on file prior to visit.   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   Found on CT scan 2022   Ascending aortic aneurysm (HCC) 04/20/2021   BMI 36.0-36.9,adult 07/09/2020   Chest pain 04/08/2021   Family history of premature CAD 04/20/2021   Fatigue 06/01/2021   GERD (gastroesophageal reflux disease) 04/08/2021   Major depressive disorder, recurrent, moderate (HCC)    Migraine without aura, not intractable, without status migrainosus    NSVT (nonsustained ventricular tachycardia) (HCC) 06/01/2021   Palpitations 04/20/2021   PAT (paroxysmal atrial tachycardia) (HCC) 06/01/2021   PVC's (premature ventricular contractions) 04/20/2021   Rash of foot 04/05/2022   Shortness of breath 06/01/2021   Shoulder pain, left 01/20/2021   Tobacco use 04/20/2021   Past Surgical History:  Procedure Laterality Date   ANTERIOR CERVICAL DECOMP/DISCECTOMY FUSION N/A 05/29/2023   Procedure: Anterior Cervical Decompression Fusion - Cervical four-Cervical five - Cervical five-Cervical six - Cervical six-Cervical seven;  Surgeon: Arman Bogus, MD;  Location: [facility] OR;  Service: Neurosurgery;  Laterality: N/A;   DILATION AND CURETTAGE OF UTERUS     multiple miscarriages   exploratory laparotomy multiple times for endometriosis     KNEE ARTHROSCOPY Right    TONSILLECTOMY     TUBAL LIGATION     VAGINAL HYSTERECTOMY      Family History  Problem Relation Age of Onset   Breast cancer Mother    Hypothyroidism Mother    Hypertension Father    Hyperlipidemia Father    CVA Father    Social History   Socioeconomic History   Marital status: Divorced     Spouse name: Not on file   Number of children: 2   Years of education: Not on file   Highest education level: Associate degree: occupational, Scientist, product/process development, or vocational program  Occupational History   Not on file  Tobacco Use   Smoking status: Former    Current packs/day: 0.00    Average packs/day: 1.5 packs/day for 33.0 years (49.5 ttl pk-yrs)    Types: Cigarettes    Start date: 12/10/1988    Quit date: 12/10/2021    Years since quitting: 1.9   Smokeless tobacco: Never  Vaping Use   Vaping status: Former   Quit date: 03/13/2023  Substance and Sexual Activity   Alcohol  use: Not Currently   Drug use: Not Currently   Sexual activity: Not Currently  Other Topics Concern   Not on file  Social History Narrative   Right handed   Social Drivers of Health   Financial Resource Strain: Medium Risk (10/25/2023)   Overall Financial Resource Strain (CARDIA)    Difficulty of Paying Living Expenses: Somewhat hard  Food Insecurity: Food Insecurity Present (10/25/2023)   Hunger Vital Sign    Worried About Running Out of Food in the Last Year: Sometimes true    Ran Out of Food in the Last Year: Never true  Transportation Needs: No Transportation Needs (10/25/2023)   PRAPARE - Administrator, Civil Service (Medical): No    Lack of Transportation (Non-Medical): No  Physical Activity: Insufficiently Active (10/25/2023)   Exercise Vital Sign    Days of Exercise per Week: 3 days    Minutes of Exercise per Session: 30 min  Stress: Stress Concern Present (10/25/2023)   [employer] of Occupational Health - Occupational Stress Questionnaire    Feeling of Stress : Very much  Social Connections: Socially Isolated (10/25/2023)   Social Connection and Isolation Panel [NHANES]    Frequency of Communication with Friends and Family: Three times a week    Frequency of Social Gatherings with Friends and Family: Once a week    Attends Religious Services: Never    Programmer, systems: No    Attends Engineer, structural: Not on file    Marital Status: Divorced    Objective:  BP 120/80   Pulse (!) 103   Temp 97.6 F (36.4 C)   Ht 5\' 6"  (1.676 m)   Wt 247 lb (112 kg)   SpO2 96%   BMI 39.87 kg/m      11/23/2023   10:42 AM 10/25/2023    9:36 AM 07/19/2023    8:38 AM  BP/Weight  Systolic BP 120 134 110  Diastolic BP 80 76 76  Wt. (Lbs) 247 244 242  BMI 39.87 kg/m2 39.38 kg/m2 39.06 kg/m2    Physical Exam Vitals reviewed.  Constitutional:      Appearance: Normal appearance. She is obese.  Neck:     Vascular: No carotid bruit.  Cardiovascular:     Rate and Rhythm: Normal rate and regular rhythm.     Heart sounds: Normal heart sounds.  Pulmonary:     Effort: Pulmonary effort is normal. No respiratory distress.     Breath sounds: Normal breath sounds.  Abdominal:     Palpations: Abdomen is soft.     Tenderness: There is no abdominal tenderness.  Musculoskeletal:     Comments: Left shoulder: Improved abduction. Can go to 100 degrees.    Neurological:     Mental Status: She is alert and oriented to person, place, and time.  Psychiatric:        Mood and Affect: Mood normal.        Behavior: Behavior normal.     Diabetic Foot Exam - Simple   No data filed      Lab Results  Component Value Date   WBC 6.6 10/25/2023   HGB 14.3 10/25/2023   HCT 42.5 10/25/2023   PLT 405 10/25/2023   GLUCOSE 88 10/25/2023   CHOL 209 (H) 10/25/2023   TRIG 92 10/25/2023   HDL 61 10/25/2023   LDLCALC 132 (H) 10/25/2023   ALT 19 10/25/2023   AST 18 10/25/2023   NA 140 10/25/2023  K 4.4 10/25/2023   CL 103 10/25/2023   CREATININE 0.84 10/25/2023   BUN 13 10/25/2023   CO2 19 (L) 10/25/2023   TSH 1.530 03/27/2023   INR 1.0 05/26/2023      Assessment & Plan:    Paroxysmal SVT (supraventricular tachycardia) (HCC) Assessment & Plan: Stable. Continue propranolol 20 mg 1/2 twice daily. Continue to avoid caffeine.     Mixed  hyperlipidemia Assessment & Plan: Fair control No medicines.  Continue to work on eating a healthy diet and exercise.  Labs drawn today.     Severe recurrent seasonal major depression (HCC) Assessment & Plan: Patient reports dizziness with current medication (30mg ). Previous trials of Zoloft, Prozac, Venlafaxine, Wellbutrin, and Pristiq were either ineffective or caused side effects. -Discontinue current medication. -Start Trintellix (dose not specified in conversation).   Chronic left shoulder pain Assessment & Plan: Patient reports a "pop" in the shoulder with subsequent improvement in pain and mobility. Still some residual pain with certain movements. -Continue current regimen of Gabapentin 600mg  twice daily. -Encourage continued movement to prevent refreezing of the joint. -Follow-up appointments scheduled with Dr. Yetta Barre and Dr. Dion Saucier next month.   Pain management Assessment & Plan: Patient reports decreased need for Hydrocodone. -Continue as needed use of Hydrocodone. Patient has sufficient supply.       Follow-up: Return for brady, Norwood Levo.   I,Marla I Leal-Borjas,acting as a scribe for Blane Ohara, MD.,have documented all relevant documentation on the behalf of Blane Ohara, MD,as directed by  Blane Ohara, MD while in the presence of Blane Ohara, MD.   An After Visit Summary was printed and given to the patient.  I attest that I have reviewed this visit and agree with the plan scribed by my staff.   Blane Ohara, MD [facility] [phone]

## 2023-11-26 DIAGNOSIS — R52 Pain, unspecified: Secondary | ICD-10-CM | POA: Insufficient documentation

## 2023-11-26 NOTE — Assessment & Plan Note (Signed)
Stable. Continue propranolol 20 mg 1/2 twice daily. Continue to avoid caffeine.

## 2023-11-26 NOTE — Assessment & Plan Note (Signed)
Fair control No medicines.  Continue to work on eating a healthy diet and exercise.  Labs drawn today.

## 2023-11-26 NOTE — Assessment & Plan Note (Signed)
Patient reports dizziness with current medication (30mg ). Previous trials of Zoloft, Prozac, Venlafaxine, Wellbutrin, and Pristiq were either ineffective or caused side effects. -Discontinue current medication. -Start Trintellix (dose not specified in conversation).

## 2023-11-26 NOTE — Assessment & Plan Note (Signed)
Patient reports decreased need for Hydrocodone. -Continue as needed use of Hydrocodone. Patient has sufficient supply.

## 2023-11-26 NOTE — Assessment & Plan Note (Signed)
Patient reports a "pop" in the shoulder with subsequent improvement in pain and mobility. Still some residual pain with certain movements. -Continue current regimen of Gabapentin 600mg  twice daily. -Encourage continued movement to prevent refreezing of the joint. -Follow-up appointments scheduled with Dr. Yetta Barre and Dr. Dion Saucier next month.

## 2023-11-29 ENCOUNTER — Encounter: Payer: Self-pay | Admitting: Family Medicine

## 2023-12-25 ENCOUNTER — Ambulatory Visit: Payer: BC Managed Care – PPO | Admitting: Family Medicine

## 2023-12-25 ENCOUNTER — Encounter: Payer: Self-pay | Admitting: Family Medicine

## 2023-12-25 VITALS — BP 112/68 | HR 68 | Temp 97.3°F | Ht 66.0 in | Wt 250.0 lb

## 2023-12-25 DIAGNOSIS — F331 Major depressive disorder, recurrent, moderate: Secondary | ICD-10-CM

## 2023-12-25 DIAGNOSIS — M5412 Radiculopathy, cervical region: Secondary | ICD-10-CM | POA: Diagnosis not present

## 2023-12-25 DIAGNOSIS — R5382 Chronic fatigue, unspecified: Secondary | ICD-10-CM

## 2023-12-25 DIAGNOSIS — E559 Vitamin D deficiency, unspecified: Secondary | ICD-10-CM | POA: Diagnosis not present

## 2023-12-25 DIAGNOSIS — F411 Generalized anxiety disorder: Secondary | ICD-10-CM

## 2023-12-25 DIAGNOSIS — F332 Major depressive disorder, recurrent severe without psychotic features: Secondary | ICD-10-CM

## 2023-12-25 DIAGNOSIS — F900 Attention-deficit hyperactivity disorder, predominantly inattentive type: Secondary | ICD-10-CM

## 2023-12-25 MED ORDER — BUSPIRONE HCL 7.5 MG PO TABS
7.5000 mg | ORAL_TABLET | Freq: Two times a day (BID) | ORAL | 0 refills | Status: DC
Start: 2023-12-25 — End: 2024-01-20

## 2023-12-25 MED ORDER — LISDEXAMFETAMINE DIMESYLATE 30 MG PO CAPS
30.0000 mg | ORAL_CAPSULE | Freq: Every day | ORAL | 0 refills | Status: DC
Start: 2023-12-25 — End: 2024-02-12

## 2023-12-25 MED ORDER — CARIPRAZINE HCL 1.5 MG PO CAPS
1.5000 mg | ORAL_CAPSULE | Freq: Every day | ORAL | Status: DC
Start: 2023-12-25 — End: 2024-01-03

## 2023-12-25 NOTE — Assessment & Plan Note (Addendum)
 Chronic Difficulty staying asleep, chronic fatigue. -Consider further management options if symptoms persist. -Check Vitamin D levels. -If low, prescribe prescription strength Vitamin D 50,000 units once a week for 3 months

## 2023-12-25 NOTE — Assessment & Plan Note (Addendum)
 Symptoms have worsening, not taking any medications -Has counseling available, but doesn't use it -Referral to Psych    12/25/2023   11:22 AM 11/23/2023   10:46 AM 10/25/2023    9:47 AM 07/19/2023    8:46 AM  GAD 7 : Generalized Anxiety Score  Nervous, Anxious, on Edge 3 2 2 3   Control/stop worrying 3 2 3 3   Worry too much - different things 3 2 2 1   Trouble relaxing 2 1 3 2   Restless 3 1 2 2   Easily annoyed or irritable 1 1 1 1   Afraid - awful might happen 3 2 2 2   Total GAD 7 Score 18 11 15 14   Anxiety Difficulty Somewhat difficult Somewhat difficult Very difficult Very difficult

## 2023-12-25 NOTE — Assessment & Plan Note (Signed)
 Previously managed with Adderall, discontinued due to cardiac concerns. Patient reports feeling better when on ADD medication. -Start Vyvanse 30mg  daily, check for interactions with current medications. -FU 1 month

## 2023-12-25 NOTE — Assessment & Plan Note (Signed)
 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 Managed with gabapentin as needed. -Continue gabapentin as needed.

## 2023-12-25 NOTE — Assessment & Plan Note (Addendum)
 Chronic symptoms, previously managed with multiple medications including Prozac and Pristiq . Currently off all medications. Patient expresses frustration and lack of motivation. No suicidal ideation.    12/25/2023   11:22 AM 11/23/2023   10:45 AM 10/25/2023    9:46 AM  Depression screen PHQ 2/9  Decreased Interest 2 1 2   Down, Depressed, Hopeless 2 1 3   PHQ - 2 Score 4 2 5   Altered sleeping 2 3 2   Tired, decreased energy 3 3 2   Change in appetite 1 1 1   Feeling bad or failure about yourself  2 1 3   Trouble concentrating 3 3 3   Moving slowly or fidgety/restless 2 2 2   Suicidal thoughts 0 0 2  PHQ-9 Score 17 15 20   Difficult doing work/chores Somewhat difficult Somewhat difficult Somewhat difficult    -Start Vraylar , provide samples and instruct patient to message via MyChart if effective. -Refer to a specialist for further management. -FU in 1 month

## 2023-12-25 NOTE — Assessment & Plan Note (Signed)
 Previously diagnosed, currently not taking supplements. Can affect mood and energy levels. -Check Vitamin D levels. -If low, prescribe prescription strength Vitamin D 50,000 units once a week for 3 months

## 2023-12-25 NOTE — Progress Notes (Addendum)
 Subjective:  Patient ID: Jeanette Alvarado, female    DOB: 1971-02-28  Age: 53 y.o. MRN: [id number]  Chief Complaint  Patient presents with   Depression    4 week follow up   Discussed the use of AI scribe software for clinical note transcription with the patient, who gave verbal consent to proceed.    HPI: The patient, with a history of depression, anxiety, and ADD, presents with worsening symptoms of these conditions. The patient is currently not on any daily medication, only taking gabapentin  as needed for neck pain associated with a metal plate in her neck.  Chronic fatigue She reports feeling tired all the time, with chronic fatigue for over six months. She also reports difficulty staying asleep, waking up two or three times a night, and a desire to sleep constantly. The patient also mentions significant weight gain over the past month, which she attributes to menopause and her current lifestyle.  ADD The patient has a history of taking various medications for these conditions, including Adderall for ADD, which she stopped due to concerns about her heart. She also mentions having tried multiple antidepressants throughout her adult life, including Prozac and Pristiq , but these eventually stopped working or led to suicidal thoughts.   Depression: Patient is here today for a 4 week follow up. Patient was started on Trintellix. Patient stated that she tried the medication for 4-5 days and it constantly made her itch so she stopped the medication. The patient's symptoms significantly impact her quality of life, leading to frustration and a lack of care about anything. She reports that the only reason she gets out of bed is to go to work. Despite this, she maintains a physically active lifestyle, walking eight to ten miles a day at work.       12/25/2023   11:22 AM 11/23/2023   10:45 AM 10/25/2023    9:46 AM 07/19/2023    8:46 AM 03/27/2023    9:12 AM  Depression screen PHQ 2/9  Decreased  Interest 2 1 2 3  0  Down, Depressed, Hopeless 2 1 3 3 1   PHQ - 2 Score 4 2 5 6 1   Altered sleeping 2 3 2 2 3   Tired, decreased energy 3 3 2 3 3   Change in appetite 1 1 1  0 0  Feeling bad or failure about yourself  2 1 3 2 1   Trouble concentrating 3 3 3 3 3   Moving slowly or fidgety/restless 2 2 2 1 3   Suicidal thoughts 0 0 2 0 0  PHQ-9 Score 17 15 20 17 14   Difficult doing work/chores Somewhat difficult Somewhat difficult Somewhat difficult Very difficult Somewhat difficult        12/25/2023   11:22 AM  Fall Risk   Falls in the past year? 0  Number falls in past yr: 0  Injury with Fall? 0  Risk for fall due to : No Fall Risks  Follow up Falls evaluation completed    Patient Care Team: Sherre Clapper, MD as PCP - General (Internal Medicine) Tobb, Kardie, DO as PCP - Cardiology (Cardiology)   Review of Systems  Constitutional:  Positive for fatigue. Negative for appetite change and fever.  HENT:  Negative for congestion, ear pain, sinus pressure and sore throat.   Respiratory:  Negative for cough, chest tightness, shortness of breath and wheezing.   Cardiovascular:  Negative for chest pain and palpitations.  Gastrointestinal:  Negative for abdominal pain, constipation, diarrhea, nausea and vomiting.  Genitourinary:  Negative for dysuria and hematuria.  Musculoskeletal:  Negative for arthralgias, back pain, joint swelling and myalgias.  Skin:  Negative for rash.  Neurological:  Negative for dizziness, weakness and headaches.  Psychiatric/Behavioral:  Positive for dysphoric mood and sleep disturbance. The patient is nervous/anxious.        Has ADHD    Current Outpatient Medications on File Prior to Visit  Medication Sig Dispense Refill   atorvastatin  (LIPITOR) 10 MG tablet Take 1 tablet (10 mg total) by mouth daily. 90 tablet 0   cyclobenzaprine  (FLEXERIL ) 5 MG tablet Take 1 tablet (5 mg total) by mouth every 8 (eight) hours as needed. 90 tablet 0   gabapentin  (NEURONTIN ) 300  MG capsule TAKE 1 CAPSULE BY MOUTH EVERY MORNING, AT NOON, AND AT BEDTIME 90 capsule 3   HYDROcodone -acetaminophen  (NORCO/VICODIN) 5-325 MG tablet Take 1 tablet by mouth 2 (two) times daily as needed for severe pain (pain score 7-10) (OSteoarthritis of lumbar spine.). 60 tablet 0   predniSONE  (DELTASONE ) 50 MG tablet Take 1 tablet (50 mg total) by mouth daily with breakfast. 5 tablet 0   propranolol  (INDERAL ) 20 MG tablet Take 0.5 tablets (10 mg total) by mouth 2 (two) times daily. 60 tablet 2   SUMAtriptan  (IMITREX ) 100 MG tablet Take 1 tablet (100 mg total) by mouth every 2 (two) hours as needed for migraine. May repeat in 2 hours if headache persists or recurs. 10 tablet 5   DULoxetine  (CYMBALTA ) 30 MG capsule Take 1 capsule (30 mg total) by mouth daily for 7 days, THEN 2 capsules (60 mg total) daily for 23 days. 53 capsule 0   [DISCONTINUED] Gabapentin , Once-Daily, 300 MG TABS Take 1 tablet (300 mg total) by mouth in the morning, at noon, and at bedtime. 90 tablet 1   No current facility-administered medications on file prior to visit.   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   Found on CT scan 2022   Ascending aortic aneurysm (HCC) 04/20/2021   BMI 36.0-36.9,adult 07/09/2020   Chest pain 04/08/2021   Family history of premature CAD 04/20/2021   Fatigue 06/01/2021   GERD (gastroesophageal reflux disease) 04/08/2021   Major depressive disorder, recurrent, moderate (HCC)    Migraine without aura, not intractable, without status migrainosus    NSVT (nonsustained ventricular tachycardia) (HCC) 06/01/2021   Palpitations 04/20/2021   PAT (paroxysmal atrial tachycardia) (HCC) 06/01/2021   PVC's (premature ventricular contractions) 04/20/2021   Rash of foot 04/05/2022   Shortness of breath 06/01/2021   Shoulder pain, left 01/20/2021   Tobacco use 04/20/2021   Past Surgical History:  Procedure Laterality Date   ANTERIOR CERVICAL  DECOMP/DISCECTOMY FUSION N/A 05/29/2023   Procedure: Anterior Cervical Decompression Fusion - Cervical four-Cervical five - Cervical five-Cervical six - Cervical six-Cervical seven;  Surgeon: Joshua Alm Hamilton, MD;  Location: [facility] OR;  Service: Neurosurgery;  Laterality: N/A;   DILATION AND CURETTAGE OF UTERUS     multiple miscarriages   exploratory laparotomy multiple times for endometriosis     KNEE ARTHROSCOPY Right    TONSILLECTOMY     TUBAL LIGATION     VAGINAL HYSTERECTOMY      Family History  Problem Relation Age of Onset   Breast cancer Mother    Hypothyroidism Mother    Hypertension Father    Hyperlipidemia Father    CVA Father    Social History   Socioeconomic History  Marital status: Divorced    Spouse name: Not on file   Number of children: 2   Years of education: Not on file   Highest education level: Associate degree: occupational, scientist, product/process development, or vocational program  Occupational History   Not on file  Tobacco Use   Smoking status: Former    Current packs/day: 0.00    Average packs/day: 1.5 packs/day for 33.0 years (49.5 ttl pk-yrs)    Types: Cigarettes    Start date: 12/10/1988    Quit date: 12/10/2021    Years since quitting: 2.0   Smokeless tobacco: Never  Vaping Use   Vaping status: Former   Quit date: 03/13/2023  Substance and Sexual Activity   Alcohol use: Not Currently   Drug use: Not Currently   Sexual activity: Not Currently  Other Topics Concern   Not on file  Social History Narrative   Right handed   Social Drivers of Health   Financial Resource Strain: Low Risk  (12/22/2023)   Overall Financial Resource Strain (CARDIA)    Difficulty of Paying Living Expenses: Not very hard  Recent Concern: Financial Resource Strain - Medium Risk (10/25/2023)   Overall Financial Resource Strain (CARDIA)    Difficulty of Paying Living Expenses: Somewhat hard  Food Insecurity: No Food Insecurity (12/22/2023)   Hunger Vital Sign    Worried About Running Out of  Food in the Last Year: Never true    Ran Out of Food in the Last Year: Never true  Recent Concern: Food Insecurity - Food Insecurity Present (10/25/2023)   Hunger Vital Sign    Worried About Running Out of Food in the Last Year: Sometimes true    Ran Out of Food in the Last Year: Never true  Transportation Needs: No Transportation Needs (12/22/2023)   PRAPARE - Administrator, Civil Service (Medical): No    Lack of Transportation (Non-Medical): No  Physical Activity: Sufficiently Active (12/22/2023)   Exercise Vital Sign    Days of Exercise per Week: 3 days    Minutes of Exercise per Session: 60 min  Recent Concern: Physical Activity - Insufficiently Active (10/25/2023)   Exercise Vital Sign    Days of Exercise per Week: 3 days    Minutes of Exercise per Session: 30 min  Stress: Stress Concern Present (12/22/2023)   [employer] of Occupational Health - Occupational Stress Questionnaire    Feeling of Stress : Very much  Social Connections: Moderately Isolated (12/22/2023)   Social Connection and Isolation Panel [NHANES]    Frequency of Communication with Friends and Family: Three times a week    Frequency of Social Gatherings with Friends and Family: Once a week    Attends Religious Services: 1 to 4 times per year    Active Member of [employer] or Organizations: No    Attends Engineer, Structural: Not on file    Marital Status: Divorced    Objective:  BP 112/68 (BP Location: Left Arm, Patient Position: Sitting)   Pulse 68   Temp (!) 97.3 F (36.3 C) (Temporal)   Ht 5' 6 (1.676 m)   Wt 250 lb (113.4 kg)   SpO2 96%   BMI 40.35 kg/m      12/25/2023   11:14 AM 11/23/2023   10:42 AM 10/25/2023    9:36 AM  BP/Weight  Systolic BP 112 120 134  Diastolic BP 68 80 76  Wt. (Lbs) 250 247 244  BMI 40.35 kg/m2 39.87 kg/m2 39.38 kg/m2  Physical Exam Constitutional:      General: She is not in acute distress.    Appearance: Normal appearance.  Eyes:      Conjunctiva/sclera: Conjunctivae normal.  Cardiovascular:     Rate and Rhythm: Normal rate and regular rhythm.     Heart sounds: Normal heart sounds. No murmur heard. Pulmonary:     Effort: Pulmonary effort is normal.     Breath sounds: Normal breath sounds. No wheezing.  Abdominal:     General: Bowel sounds are normal.     Palpations: Abdomen is soft.     Tenderness: There is no abdominal tenderness.  Skin:    General: Skin is warm.  Neurological:     Mental Status: She is alert. Mental status is at baseline.  Psychiatric:        Mood and Affect: Affect is tearful.        Speech: Speech normal.        Behavior: Behavior normal.        Thought Content: Thought content normal.     Lab Results  Component Value Date   WBC 6.6 10/25/2023   HGB 14.3 10/25/2023   HCT 42.5 10/25/2023   PLT 405 10/25/2023   GLUCOSE 88 10/25/2023   CHOL 209 (H) 10/25/2023   TRIG 92 10/25/2023   HDL 61 10/25/2023   LDLCALC 132 (H) 10/25/2023   ALT 19 10/25/2023   AST 18 10/25/2023   NA 140 10/25/2023   K 4.4 10/25/2023   CL 103 10/25/2023   CREATININE 0.84 10/25/2023   BUN 13 10/25/2023   CO2 19 (L) 10/25/2023   TSH 1.530 03/27/2023   INR 1.0 05/26/2023      Assessment & Plan:    Cervical radiculopathy Assessment & Plan: Chronic Pain related to metal plate in neck, exacerbated by cold weather. Managed with gabapentin  as needed. -Continue gabapentin  as needed.   Vitamin D  deficiency disease Assessment & Plan: Previously diagnosed, currently not taking supplements. Can affect mood and energy levels. -Check Vitamin D  levels. -If low, prescribe prescription strength Vitamin D  50,000 units once a week for 3 months   Chronic fatigue Assessment & Plan: Chronic Difficulty staying asleep, chronic fatigue. -Consider further management options if symptoms persist. -Check Vitamin D  levels. -If low, prescribe prescription strength Vitamin D  50,000 units once a week for 3  months  Orders: -     VITAMIN D  25 Hydroxy (Vit-D Deficiency, Fractures)  Attention deficit hyperactivity disorder (ADHD), predominantly inattentive type Assessment & Plan: Previously managed with Adderall, discontinued due to cardiac concerns. Patient reports feeling better when on ADD medication. -Start Vyvanse  30mg  daily, check for interactions with current medications. -FU 1 month  Orders: -     Lisdexamfetamine Dimesylate ; Take 1 capsule (30 mg total) by mouth daily.  Dispense: 30 capsule; Refill: 0  Major depressive disorder, recurrent, moderate (HCC) Assessment & Plan: Chronic symptoms, previously managed with multiple medications including Prozac and Pristiq . Currently off all medications. Patient expresses frustration and lack of motivation. No suicidal ideation.    12/25/2023   11:22 AM 11/23/2023   10:45 AM 10/25/2023    9:46 AM  Depression screen PHQ 2/9  Decreased Interest 2 1 2   Down, Depressed, Hopeless 2 1 3   PHQ - 2 Score 4 2 5   Altered sleeping 2 3 2   Tired, decreased energy 3 3 2   Change in appetite 1 1 1   Feeling bad or failure about yourself  2 1 3   Trouble concentrating 3 3  3  Moving slowly or fidgety/restless 2 2 2   Suicidal thoughts 0 0 2  PHQ-9 Score 17 15 20   Difficult doing work/chores Somewhat difficult Somewhat difficult Somewhat difficult    -Start Vraylar , provide samples and instruct patient to message via MyChart if effective. -Refer to a specialist for further management. -FU in 1 month  Orders: -     Ambulatory referral to Psychiatry -     busPIRone  HCl; Take 1 tablet (7.5 mg total) by mouth 2 (two) times daily.  Dispense: 60 tablet; Refill: 0 -     Cariprazine  HCl; Take 1 capsule (1.5 mg total) by mouth daily for 14 days.  GAD (generalized anxiety disorder) Assessment & Plan: Symptoms have worsening, not taking any medications -Has counseling available, but doesn't use it -Referral to Psych    12/25/2023   11:22 AM 11/23/2023    10:46 AM 10/25/2023    9:47 AM 07/19/2023    8:46 AM  GAD 7 : Generalized Anxiety Score  Nervous, Anxious, on Edge 3 2 2 3   Control/stop worrying 3 2 3 3   Worry too much - different things 3 2 2 1   Trouble relaxing 2 1 3 2   Restless 3 1 2 2   Easily annoyed or irritable 1 1 1 1   Afraid - awful might happen 3 2 2 2   Total GAD 7 Score 18 11 15 14   Anxiety Difficulty Somewhat difficult Somewhat difficult Very difficult Very difficult      Orders: -     busPIRone  HCl; Take 1 tablet (7.5 mg total) by mouth 2 (two) times daily.  Dispense: 60 tablet; Refill: 0    Meds ordered this encounter  Medications   lisdexamfetamine (VYVANSE ) 30 MG capsule    Sig: Take 1 capsule (30 mg total) by mouth daily.    Dispense:  30 capsule    Refill:  0   busPIRone  (BUSPAR ) 7.5 MG tablet    Sig: Take 1 tablet (7.5 mg total) by mouth 2 (two) times daily.    Dispense:  60 tablet    Refill:  0   cariprazine  (VRAYLAR ) 1.5 MG capsule    Sig: Take 1 capsule (1.5 mg total) by mouth daily for 14 days.    Orders Placed This Encounter  Procedures   Vitamin D , 25-hydroxy   Ambulatory referral to Psychiatry     Follow-up: Return in about 4 weeks (around 01/22/2024) for med check.    An After Visit Summary was printed and given to the patient.  Total time spent on today's visit was 45 minutes, including both face-to-face time and nonface-to-face time personally spent on review of chart (labs and imaging), discussing labs and goals, discussing further work-up, treatment options, referrals to specialist if needed, reviewing outside records if pertinent, answering patient's questions, and coordinating care.   Harrie Cedar, FNP [facility] [phone]

## 2023-12-26 ENCOUNTER — Other Ambulatory Visit: Payer: Self-pay | Admitting: Family Medicine

## 2023-12-26 DIAGNOSIS — E559 Vitamin D deficiency, unspecified: Secondary | ICD-10-CM

## 2023-12-26 LAB — VITAMIN D 25 HYDROXY (VIT D DEFICIENCY, FRACTURES): Vit D, 25-Hydroxy: 23.9 ng/mL — ABNORMAL LOW (ref 30.0–100.0)

## 2023-12-26 MED ORDER — VITAMIN D (ERGOCALCIFEROL) 1.25 MG (50000 UNIT) PO CAPS
50000.0000 [IU] | ORAL_CAPSULE | ORAL | 0 refills | Status: DC
Start: 2023-12-26 — End: 2024-03-18

## 2024-01-01 NOTE — Plan of Care (Signed)
Thank you for bringing this to my attention. I have corrected the documentation

## 2024-01-03 ENCOUNTER — Other Ambulatory Visit: Payer: Self-pay | Admitting: Family Medicine

## 2024-01-03 DIAGNOSIS — F331 Major depressive disorder, recurrent, moderate: Secondary | ICD-10-CM

## 2024-01-03 NOTE — Telephone Encounter (Signed)
Copied from CRM [id number]. Topic: Clinical - Medication Refill >> Jan 03, 2024 12:34 PM Shelah Lewandowsky wrote: Most Recent Primary Care Visit:  Provider: Renne Crigler  Department: [facility]  Visit Type: OFFICE VISIT  Date: 12/25/2023  Medication: cariprazine (VRAYLAR) 1.5 MG capsule  Has the patient contacted their pharmacy? No (Agent: If no, request that the patient contact the pharmacy for the refill. If patient does not wish to contact the pharmacy document the reason why and proceed with request.) (Agent: If yes, when and what did the pharmacy advise?)  Is this the correct pharmacy for this prescription? Yes If no, delete pharmacy and type the correct one.  This is the patient's preferred pharmacy:  [id number] Rosalita Levan, [place] - [address] AT [facility] [address] [place] [address] Phone: [phone] Fax: [phone]   Has the prescription been filled recently? No  Is the patient out of the medication? No  Has the patient been seen for an appointment in the last year OR does the patient have an upcoming appointment? Yes  Can we respond through MyChart? Yes  Agent: Please be advised that Rx refills may take up to 3 business days. We ask that you follow-up with your pharmacy.

## 2024-01-04 MED ORDER — CARIPRAZINE HCL 1.5 MG PO CAPS
1.5000 mg | ORAL_CAPSULE | Freq: Every day | ORAL | Status: AC
Start: 2024-01-04 — End: 2024-01-18

## 2024-01-08 ENCOUNTER — Telehealth: Payer: Self-pay | Admitting: Family Medicine

## 2024-01-08 NOTE — Telephone Encounter (Signed)
ERROR

## 2024-01-20 ENCOUNTER — Other Ambulatory Visit: Payer: Self-pay | Admitting: Family Medicine

## 2024-01-20 DIAGNOSIS — F331 Major depressive disorder, recurrent, moderate: Secondary | ICD-10-CM

## 2024-01-20 DIAGNOSIS — F411 Generalized anxiety disorder: Secondary | ICD-10-CM

## 2024-01-25 ENCOUNTER — Other Ambulatory Visit: Payer: Self-pay | Admitting: Family Medicine

## 2024-01-25 DIAGNOSIS — F331 Major depressive disorder, recurrent, moderate: Secondary | ICD-10-CM

## 2024-01-25 MED ORDER — CARIPRAZINE HCL 1.5 MG PO CAPS
1.5000 mg | ORAL_CAPSULE | Freq: Every day | ORAL | 0 refills | Status: DC
Start: 2024-01-25 — End: 2024-02-29

## 2024-01-26 ENCOUNTER — Encounter: Payer: Self-pay | Admitting: Family Medicine

## 2024-01-26 ENCOUNTER — Other Ambulatory Visit: Payer: Self-pay | Admitting: Family Medicine

## 2024-01-26 ENCOUNTER — Ambulatory Visit: Payer: BC Managed Care – PPO | Admitting: Family Medicine

## 2024-01-26 VITALS — BP 100/78 | HR 77 | Temp 97.8°F | Ht 66.0 in | Wt 257.0 lb

## 2024-01-26 DIAGNOSIS — F19982 Other psychoactive substance use, unspecified with psychoactive substance-induced sleep disorder: Secondary | ICD-10-CM | POA: Diagnosis not present

## 2024-01-26 DIAGNOSIS — E661 Drug-induced obesity: Secondary | ICD-10-CM | POA: Insufficient documentation

## 2024-01-26 DIAGNOSIS — F900 Attention-deficit hyperactivity disorder, predominantly inattentive type: Secondary | ICD-10-CM

## 2024-01-26 DIAGNOSIS — F331 Major depressive disorder, recurrent, moderate: Secondary | ICD-10-CM

## 2024-01-26 DIAGNOSIS — H66002 Acute suppurative otitis media without spontaneous rupture of ear drum, left ear: Secondary | ICD-10-CM

## 2024-01-26 DIAGNOSIS — E559 Vitamin D deficiency, unspecified: Secondary | ICD-10-CM

## 2024-01-26 DIAGNOSIS — E66813 Obesity, class 3: Secondary | ICD-10-CM

## 2024-01-26 DIAGNOSIS — Z6841 Body Mass Index (BMI) 40.0 and over, adult: Secondary | ICD-10-CM

## 2024-01-26 MED ORDER — AZITHROMYCIN 250 MG PO TABS
ORAL_TABLET | ORAL | 0 refills | Status: DC
Start: 2024-01-26 — End: 2024-05-03

## 2024-01-26 MED ORDER — OZEMPIC (0.25 OR 0.5 MG/DOSE) 2 MG/3ML ~~LOC~~ SOPN
0.2500 mg | PEN_INJECTOR | SUBCUTANEOUS | 0 refills | Status: DC
Start: 2024-01-26 — End: 2024-05-03

## 2024-01-26 MED ORDER — RAMELTEON 8 MG PO TABS
8.0000 mg | ORAL_TABLET | Freq: Every day | ORAL | 0 refills | Status: DC
Start: 2024-01-26 — End: 2024-05-03

## 2024-01-26 NOTE — Progress Notes (Signed)
 Subjective:  Patient ID: Jeanette Alvarado, female    DOB: [birth year]  Age: 53 y.o. MRN: [id number]  Chief Complaint  Patient presents with   Depression    1 Month follow up    Discussed the use of AI scribe software for clinical note transcription with the patient, who gave verbal consent to proceed.   HPI Jeanette Alvarado is a 53 year old female who presents with difficulty sleeping and unexplained weight gain.  Depression: Patient was started on Vraylar one month ago, and patient states that the medication was working at first but a week after starting the medication her Father passed away. Patient states she has also started seeing a Veterinary surgeon. She experiences anxiety and depression, which are worsened by her weight gain. She feels restless, including leg bumping, which she attributes to anxiety. She is currently taking Vyvanse but does not notice any difference.  Insomnia: She has persistent difficulty sleeping and has tried NyQuil in the past without success. She is hesitant to try Ambien due to concerns about its side effects and experiences nightmares.  Obesity: She has gained 7 to 10 pounds per month over the past few months, reaching 260 pounds. Her diet remains unchanged, and she is mindful of her eating habits, typically snacking during the day and having a full meal at dinner. She denies emotional eating and has no dietary restrictions. She previously exercised at the gym four to five days a week before a recent surgery but has not engaged in structured weight loss programs. She desires to weigh under 200 pounds, ideally around 180 pounds.   She has a history of sinus issues and is currently experiencing symptoms of a sinus infection, including sore throat, sinus drainage, watery eyes, and a cough that started the previous day. She reports postnasal drip and itching in her left ear, which appears red. No recurrent ear infections, but sinus issues occur, especially with weather  changes. She takes Zyrtec seasonally for allergies.      01/26/2024   10:05 AM 12/25/2023   11:22 AM 11/23/2023   10:45 AM 10/25/2023    9:46 AM 07/19/2023    8:46 AM  Depression screen PHQ 2/9  Decreased Interest 2 2 1 2 3   Down, Depressed, Hopeless 1 2 1 3 3   PHQ - 2 Score 3 4 2 5 6   Altered sleeping 3 2 3 2 2   Tired, decreased energy 3 3 3 2 3   Change in appetite 1 1 1 1  0  Feeling bad or failure about yourself  2 2 1 3 2   Trouble concentrating 1 3 3 3 3   Moving slowly or fidgety/restless 1 2 2 2 1   Suicidal thoughts 0 0 0 2 0  PHQ-9 Score 14 17 15 20 17   Difficult doing work/chores Somewhat difficult Somewhat difficult Somewhat difficult Somewhat difficult Very difficult        12/25/2023   11:22 AM  Fall Risk   Falls in the past year? 0  Number falls in past yr: 0  Injury with Fall? 0  Risk for fall due to : No Fall Risks  Follow up Falls evaluation completed    Patient Care Team: Blane Ohara, MD as PCP - General (Internal Medicine) Thomasene Ripple, DO as PCP - Cardiology (Cardiology)   Review of Systems  Constitutional:  Negative for appetite change, fatigue and fever.  HENT:  Positive for ear pain (right ear itching) and sore throat. Negative for congestion and sinus pressure.  Respiratory:  Negative for cough, chest tightness, shortness of breath and wheezing.   Cardiovascular:  Negative for chest pain and palpitations.  Gastrointestinal:  Negative for abdominal pain, constipation, diarrhea, nausea and vomiting.  Genitourinary:  Negative for dysuria and hematuria.  Musculoskeletal:  Negative for arthralgias, back pain, joint swelling and myalgias.  Skin:  Negative for rash.  Neurological:  Negative for dizziness, weakness and headaches.  Psychiatric/Behavioral:  Positive for depression, dysphoric mood and sleep disturbance (Not sleeping but maybe 2-4 hours a night.). The patient is nervous/anxious.     Current Outpatient Medications on File Prior to Visit   Medication Sig Dispense Refill   busPIRone (BUSPAR) 7.5 MG tablet TAKE 1 TABLET(7.5 MG) BY MOUTH TWICE DAILY 60 tablet 0   cariprazine (VRAYLAR) 1.5 MG capsule Take 1 capsule (1.5 mg total) by mouth daily. 30 capsule 0   cyclobenzaprine (FLEXERIL) 5 MG tablet Take 1 tablet (5 mg total) by mouth every 8 (eight) hours as needed. 90 tablet 0   gabapentin (NEURONTIN) 300 MG capsule TAKE 1 CAPSULE BY MOUTH EVERY MORNING, AT NOON, AND AT BEDTIME 90 capsule 3   HYDROcodone-acetaminophen (NORCO/VICODIN) 5-325 MG tablet Take 1 tablet by mouth 2 (two) times daily as needed for severe pain (pain score 7-10) (OSteoarthritis of lumbar spine.). 60 tablet 0   lisdexamfetamine (VYVANSE) 30 MG capsule Take 1 capsule (30 mg total) by mouth daily. 30 capsule 0   propranolol (INDERAL) 20 MG tablet Take 0.5 tablets (10 mg total) by mouth 2 (two) times daily. 60 tablet 2   SUMAtriptan (IMITREX) 100 MG tablet Take 1 tablet (100 mg total) by mouth every 2 (two) hours as needed for migraine. May repeat in 2 hours if headache persists or recurs. 10 tablet 5   Vitamin D, Ergocalciferol, (DRISDOL) 1.25 MG (50000 UNIT) CAPS capsule Take 1 capsule (50,000 Units total) by mouth every 7 (seven) days for 12 doses. 12 capsule 0   [DISCONTINUED] Gabapentin, Once-Daily, 300 MG TABS Take 1 tablet (300 mg total) by mouth in the morning, at noon, and at bedtime. 90 tablet 1   No current facility-administered medications on file prior to visit.   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   Found on CT scan 2022   Ascending aortic aneurysm (HCC) 04/20/2021   BMI 36.0-36.9,adult 07/09/2020   Chest pain 04/08/2021   Family history of premature CAD 04/20/2021   Fatigue 06/01/2021   GERD (gastroesophageal reflux disease) 04/08/2021   Major depressive disorder, recurrent, moderate (HCC)    Migraine without aura, not intractable, without status migrainosus    NSVT (nonsustained  ventricular tachycardia) (HCC) 06/01/2021   Palpitations 04/20/2021   PAT (paroxysmal atrial tachycardia) (HCC) 06/01/2021   PVC's (premature ventricular contractions) 04/20/2021   Rash of foot 04/05/2022   Shortness of breath 06/01/2021   Shoulder pain, left 01/20/2021   Tobacco use 04/20/2021   Past Surgical History:  Procedure Laterality Date   ANTERIOR CERVICAL DECOMP/DISCECTOMY FUSION N/A 05/29/2023   Procedure: Anterior Cervical Decompression Fusion - Cervical four-Cervical five - Cervical five-Cervical six - Cervical six-Cervical seven;  Surgeon: Arman Bogus, MD;  Location: [facility] OR;  Service: Neurosurgery;  Laterality: N/A;   DILATION AND CURETTAGE OF UTERUS     multiple miscarriages   exploratory laparotomy multiple times for endometriosis     KNEE ARTHROSCOPY Right    TONSILLECTOMY     TUBAL LIGATION     VAGINAL  HYSTERECTOMY      Family History  Problem Relation Age of Onset   Breast cancer Mother    Hypothyroidism Mother    Hypertension Father    Hyperlipidemia Father    CVA Father    Social History   Socioeconomic History   Marital status: Divorced    Spouse name: Not on file   Number of children: 2   Years of education: Not on file   Highest education level: Associate degree: occupational, Scientist, product/process development, or vocational program  Occupational History   Not on file  Tobacco Use   Smoking status: Former    Current packs/day: 0.00    Average packs/day: 1.5 packs/day for 33.0 years (49.5 ttl pk-yrs)    Types: Cigarettes    Start date: 12/10/1988    Quit date: 12/10/2021    Years since quitting: 2.1   Smokeless tobacco: Never  Vaping Use   Vaping status: Former   Quit date: 03/13/2023  Substance and Sexual Activity   Alcohol use: Not Currently   Drug use: Not Currently   Sexual activity: Not Currently  Other Topics Concern   Not on file  Social History Narrative   Right handed   Social Drivers of Health   Financial Resource Strain: Low Risk  (12/22/2023)    Overall Financial Resource Strain (CARDIA)    Difficulty of Paying Living Expenses: Not very hard  Recent Concern: Financial Resource Strain - Medium Risk (10/25/2023)   Overall Financial Resource Strain (CARDIA)    Difficulty of Paying Living Expenses: Somewhat hard  Food Insecurity: No Food Insecurity (12/22/2023)   Hunger Vital Sign    Worried About Running Out of Food in the Last Year: Never true    Ran Out of Food in the Last Year: Never true  Recent Concern: Food Insecurity - Food Insecurity Present (10/25/2023)   Hunger Vital Sign    Worried About Running Out of Food in the Last Year: Sometimes true    Ran Out of Food in the Last Year: Never true  Transportation Needs: No Transportation Needs (12/22/2023)   PRAPARE - Administrator, Civil Service (Medical): No    Lack of Transportation (Non-Medical): No  Physical Activity: Sufficiently Active (12/22/2023)   Exercise Vital Sign    Days of Exercise per Week: 3 days    Minutes of Exercise per Session: 60 min  Recent Concern: Physical Activity - Insufficiently Active (10/25/2023)   Exercise Vital Sign    Days of Exercise per Week: 3 days    Minutes of Exercise per Session: 30 min  Stress: Stress Concern Present (12/22/2023)   [employer] of Occupational Health - Occupational Stress Questionnaire    Feeling of Stress : Very much  Social Connections: Moderately Isolated (12/22/2023)   Social Connection and Isolation Panel [NHANES]    Frequency of Communication with Friends and Family: Three times a week    Frequency of Social Gatherings with Friends and Family: Once a week    Attends Religious Services: 1 to 4 times per year    Active Member of [employer] or Organizations: No    Attends Engineer, structural: Not on file    Marital Status: Divorced    Objective:  BP 100/78 (BP Location: Left Arm, Patient Position: Sitting)   Pulse 77   Temp 97.8 F (36.6 C) (Temporal)   Ht 5\' 6"  (1.676 m)   Wt 257 lb  (116.6 kg)   SpO2 98%   BMI 41.48 kg/m  01/26/2024   10:35 AM 01/26/2024   10:03 AM 12/25/2023   11:14 AM  BP/Weight  Systolic BP 100 148 112  Diastolic BP 78 92 68  Wt. (Lbs)  257 250  BMI  41.48 kg/m2 40.35 kg/m2   Weight Management  Do you ever feel like your eating patterns can get out of control? No Do you eat between meals? Yes Do you eat as a response to your emotions?No Do you have any dietary restrictions? No Do you currently take place in physical activity? Yes  Have  you ever been diagnosed with any of the following?                               HYPERLIPIDEMIA: Group C- mixed hyperlipidemia.  What are your weight/obesity management goals? Would like to lose 70 pounds - long term goal  How many serious weight-loss attempts have you made in the past 5 years. 4  Did you participate in any structured weight-loss programs in the past and, if so, which ones?  No   What are some barriers that have kept you from losing weight and maintaining weight loss in the past? Recent surgery, time management, expense and preparation of healthy foods.  Have you ever been on an anti-obesity or weight-loss medication in the past or are you currently on one? Tried a sample of Wegovy in the past.   Physical Exam Vitals reviewed.  Constitutional:      General: She is not in acute distress.    Appearance: Normal appearance. She is not ill-appearing.  HENT:     Right Ear: Tympanic membrane normal.     Left Ear: Tympanic membrane is erythematous.     Nose: Rhinorrhea present.     Right Sinus: Maxillary sinus tenderness and frontal sinus tenderness present.     Left Sinus: Maxillary sinus tenderness and frontal sinus tenderness present.     Mouth/Throat:     Pharynx: No posterior oropharyngeal erythema.     Tonsils: 1+ on the right. 1+ on the left.  Cardiovascular:     Rate and Rhythm: Normal rate and regular rhythm.     Heart sounds: Normal heart sounds. No murmur  heard. Pulmonary:     Effort: Pulmonary effort is normal.     Breath sounds: Normal breath sounds. No wheezing.  Abdominal:     General: Bowel sounds are normal.     Palpations: Abdomen is soft.     Tenderness: There is no abdominal tenderness.  Musculoskeletal:     Cervical back: Normal range of motion.  Neurological:     Mental Status: She is alert. Mental status is at baseline.  Psychiatric:        Mood and Affect: Mood normal.        Behavior: Behavior normal.     Diabetic Foot Exam - Simple   No data filed      Lab Results  Component Value Date   WBC 6.6 10/25/2023   HGB 14.3 10/25/2023   HCT 42.5 10/25/2023   PLT 405 10/25/2023   GLUCOSE 88 10/25/2023   CHOL 209 (H) 10/25/2023   TRIG 92 10/25/2023   HDL 61 10/25/2023   LDLCALC 132 (H) 10/25/2023   ALT 19 10/25/2023   AST 18 10/25/2023   NA 140 10/25/2023   K 4.4 10/25/2023   CL 103 10/25/2023   CREATININE 0.84 10/25/2023   BUN 13 10/25/2023   CO2  19 (L) 10/25/2023   TSH 1.530 03/27/2023   INR 1.0 05/26/2023      Assessment & Plan:    Non-recurrent acute suppurative otitis media of left ear without spontaneous rupture of tympanic membrane Assessment & Plan: Acute Symptoms of sinusitis and otitis media of the left ear. -Prescribe Z-Pak (azithromycin). Educated on possible side effects of medication and that she may try a probiotic for GI symptoms   Orders: -     Azithromycin; 2 DAILY FOR FIRST DAY, THEN DECREASE TO ONE DAILY FOR 4 MORE DAYS.  Dispense: 6 tablet; Refill: 0  Major depressive disorder, recurrent, moderate (HCC) Assessment & Plan: Chronic, Improving Patient recently lost her dad as feels like this medication is helping. She does has insomnia and restless legs that she is not sure is a side effect of the new medication.    01/26/2024   10:05 AM 12/25/2023   11:22 AM 11/23/2023   10:45 AM  PHQ9 SCORE ONLY  PHQ-9 Total Score 14 17 15      -Continue Vraylar, provide samples and sent Rx  to pharmacy with coupon from manufacturer. -Patient is in counseling -FU in 3 month   Drug-induced insomnia (HCC) Assessment & Plan: Difficulty sleeping, no prior use of sleep aids. Concerns about potential side effects of Ambien. -Prescribe Rozerem for bedtime use.  Orders: -     Ramelteon; Take 1 tablet (8 mg total) by mouth at bedtime.  Dispense: 30 tablet; Refill: 0  Attention deficit hyperactivity disorder (ADHD), predominantly inattentive type Assessment & Plan: Patient reports feeling better when on ADD medication. -Continue Vyvanse 30mg  daily, check for interactions with current medications. -FU 3 month   Class 3 drug-induced obesity with serious comorbidity and body mass index (BMI) of 40.0 to 44.9 in adult [facility]) Assessment & Plan: Significant weight gain over the past few months despite no significant changes in diet. BMI - 41.48, Not at goal -Referral for weight management consultation. Weight loss plan - change to whole grain breads, cereal, pasta - set goal weight - drink 6 to 8 glasses of water each day - fill half of plate with vegetables - join a weight loss program - keep a food diary - limit fast food meals to no more than 1 per week - manage portion size - prepare main meal at home 3 to 5 days each week - read food labels for fat, fiber, carbohydrates and portion size - reduce red meat to 2 to 3 times a week - set a realistic goal - switch to sugar-free drinks exercise 3-4 days a week Going a step further _ Find a healthy go-to snack that is low in carbs, sugar and fat _ Increase servings of fruit _ Increase servings of vegetables _ Reduce soda _ Limit processed foods _ consult a dietician about meal planning   Orders: -     Ozempic (0.25 or 0.5 MG/DOSE); Inject 0.25 mg into the skin once a week.  Dispense: 3 mL; Refill: 0 -     Amb Ref to Medical Weight Management  Vitamin D deficiency disease Assessment & Plan: Low vitamin D levels, currently  taking supplements. -Continue weekly Vitamin D supplementation.      Meds ordered this encounter  Medications   ramelteon (ROZEREM) 8 MG tablet    Sig: Take 1 tablet (8 mg total) by mouth at bedtime.    Dispense:  30 tablet    Refill:  0   Semaglutide,0.25 or 0.5MG /DOS, (OZEMPIC, 0.25 OR 0.5 MG/DOSE,) 2  MG/3ML SOPN    Sig: Inject 0.25 mg into the skin once a week.    Dispense:  3 mL    Refill:  0   azithromycin (ZITHROMAX) 250 MG tablet    Sig: 2 DAILY FOR FIRST DAY, THEN DECREASE TO ONE DAILY FOR 4 MORE DAYS.    Dispense:  6 tablet    Refill:  0    Orders Placed This Encounter  Procedures   Amb Ref to Medical Weight Management     Follow-up: Return in about 3 months (around 04/24/2024) for chronic, fasting.  An After Visit Summary was printed and given to the patient.  Lajuana Matte, FNP [facility] [phone]

## 2024-01-28 DIAGNOSIS — H66002 Acute suppurative otitis media without spontaneous rupture of ear drum, left ear: Secondary | ICD-10-CM | POA: Insufficient documentation

## 2024-01-28 DIAGNOSIS — F19982 Other psychoactive substance use, unspecified with psychoactive substance-induced sleep disorder: Secondary | ICD-10-CM | POA: Insufficient documentation

## 2024-01-28 NOTE — Assessment & Plan Note (Signed)
 Difficulty sleeping, no prior use of sleep aids. Concerns about potential side effects of Ambien. -Prescribe Rozerem for bedtime use.

## 2024-01-28 NOTE — Assessment & Plan Note (Signed)
 Chronic, Improving Patient recently lost her dad as feels like this medication is helping. She does has insomnia and restless legs that she is not sure is a side effect of the new medication.    01/26/2024   10:05 AM 12/25/2023   11:22 AM 11/23/2023   10:45 AM  PHQ9 SCORE ONLY  PHQ-9 Total Score 14 17 15      -Continue Vraylar, provide samples and sent Rx to pharmacy with coupon from manufacturer. -Patient is in counseling -FU in 3 month

## 2024-01-28 NOTE — Assessment & Plan Note (Signed)
 Low vitamin D levels, currently taking supplements. -Continue weekly Vitamin D supplementation.

## 2024-01-28 NOTE — Assessment & Plan Note (Addendum)
 Acute Symptoms of sinusitis and otitis media of the left ear. -Prescribe Z-Pak (azithromycin). Educated on possible side effects of medication and that she may try a probiotic for GI symptoms

## 2024-01-28 NOTE — Assessment & Plan Note (Signed)
 Significant weight gain over the past few months despite no significant changes in diet. BMI - 41.48, Not at goal -Referral for weight management consultation. Weight loss plan - change to whole grain breads, cereal, pasta - set goal weight - drink 6 to 8 glasses of water each day - fill half of plate with vegetables - join a weight loss program - keep a food diary - limit fast food meals to no more than 1 per week - manage portion size - prepare main meal at home 3 to 5 days each week - read food labels for fat, fiber, carbohydrates and portion size - reduce red meat to 2 to 3 times a week - set a realistic goal - switch to sugar-free drinks exercise 3-4 days a week Going a step further _ Find a healthy go-to snack that is low in carbs, sugar and fat _ Increase servings of fruit _ Increase servings of vegetables _ Reduce soda _ Limit processed foods _ consult a dietician about meal planning

## 2024-01-28 NOTE — Assessment & Plan Note (Signed)
 Patient reports feeling better when on ADD medication. -Continue Vyvanse 30mg  daily, check for interactions with current medications. -FU 3 month

## 2024-01-29 ENCOUNTER — Telehealth: Payer: Self-pay

## 2024-01-29 NOTE — Telephone Encounter (Signed)
 Copied from CRM [id number]. Topic: Referral - Question >> Jan 26, 2024  1:55 PM Fredrica W wrote: Reason for CRM: Caller from [facility] called sates they received notes from [place] but does not show patient as being seen by that office and provider only does primary care. I advised it should be referral for weight management. She said that would be the choose to lose program and she will forward information to the nurse for that program and patient can call and make an appt. Thank You

## 2024-01-29 NOTE — Telephone Encounter (Signed)
 Prior authorization was done for Ozempic through patient's insurance and it was Denied. Reason given from patient's insurance was that the patient does not meet medical criteria specifically that patient is not Diabetic.

## 2024-01-29 NOTE — Telephone Encounter (Signed)
 Prior Authorization was submitted to patient's insurance for vraylar. Awaiting approval or denial.

## 2024-02-12 ENCOUNTER — Other Ambulatory Visit: Payer: Self-pay

## 2024-02-12 DIAGNOSIS — F900 Attention-deficit hyperactivity disorder, predominantly inattentive type: Secondary | ICD-10-CM

## 2024-02-12 MED ORDER — LISDEXAMFETAMINE DIMESYLATE 30 MG PO CAPS
30.0000 mg | ORAL_CAPSULE | Freq: Every day | ORAL | 0 refills | Status: DC
Start: 2024-02-12 — End: 2024-03-18

## 2024-02-26 ENCOUNTER — Other Ambulatory Visit: Payer: Self-pay | Admitting: Family Medicine

## 2024-02-26 DIAGNOSIS — F411 Generalized anxiety disorder: Secondary | ICD-10-CM

## 2024-02-26 DIAGNOSIS — F331 Major depressive disorder, recurrent, moderate: Secondary | ICD-10-CM

## 2024-02-28 ENCOUNTER — Other Ambulatory Visit: Payer: Self-pay | Admitting: Family Medicine

## 2024-02-28 DIAGNOSIS — F331 Major depressive disorder, recurrent, moderate: Secondary | ICD-10-CM | POA: Diagnosis not present

## 2024-02-28 DIAGNOSIS — Z634 Disappearance and death of family member: Secondary | ICD-10-CM | POA: Diagnosis not present

## 2024-03-03 DIAGNOSIS — W010XXA Fall on same level from slipping, tripping and stumbling without subsequent striking against object, initial encounter: Secondary | ICD-10-CM | POA: Diagnosis not present

## 2024-03-03 DIAGNOSIS — M79672 Pain in left foot: Secondary | ICD-10-CM | POA: Diagnosis not present

## 2024-03-03 DIAGNOSIS — S90122A Contusion of left lesser toe(s) without damage to nail, initial encounter: Secondary | ICD-10-CM | POA: Diagnosis not present

## 2024-03-03 DIAGNOSIS — M7989 Other specified soft tissue disorders: Secondary | ICD-10-CM | POA: Diagnosis not present

## 2024-03-13 DIAGNOSIS — F331 Major depressive disorder, recurrent, moderate: Secondary | ICD-10-CM | POA: Diagnosis not present

## 2024-03-13 DIAGNOSIS — Z634 Disappearance and death of family member: Secondary | ICD-10-CM | POA: Diagnosis not present

## 2024-03-14 DIAGNOSIS — R635 Abnormal weight gain: Secondary | ICD-10-CM | POA: Diagnosis not present

## 2024-03-14 DIAGNOSIS — Z6841 Body Mass Index (BMI) 40.0 and over, adult: Secondary | ICD-10-CM | POA: Diagnosis not present

## 2024-03-16 ENCOUNTER — Other Ambulatory Visit: Payer: Self-pay | Admitting: Family Medicine

## 2024-03-16 DIAGNOSIS — E559 Vitamin D deficiency, unspecified: Secondary | ICD-10-CM

## 2024-03-18 ENCOUNTER — Other Ambulatory Visit: Payer: Self-pay | Admitting: Family Medicine

## 2024-03-18 DIAGNOSIS — F900 Attention-deficit hyperactivity disorder, predominantly inattentive type: Secondary | ICD-10-CM

## 2024-03-18 MED ORDER — LISDEXAMFETAMINE DIMESYLATE 30 MG PO CAPS
30.0000 mg | ORAL_CAPSULE | Freq: Every day | ORAL | 0 refills | Status: DC
Start: 2024-03-18 — End: 2024-05-03

## 2024-04-10 DIAGNOSIS — Z634 Disappearance and death of family member: Secondary | ICD-10-CM | POA: Diagnosis not present

## 2024-04-10 DIAGNOSIS — F331 Major depressive disorder, recurrent, moderate: Secondary | ICD-10-CM | POA: Diagnosis not present

## 2024-04-11 DIAGNOSIS — R635 Abnormal weight gain: Secondary | ICD-10-CM | POA: Diagnosis not present

## 2024-04-11 DIAGNOSIS — Z6841 Body Mass Index (BMI) 40.0 and over, adult: Secondary | ICD-10-CM | POA: Diagnosis not present

## 2024-04-24 ENCOUNTER — Other Ambulatory Visit: Payer: Self-pay | Admitting: Family Medicine

## 2024-04-24 DIAGNOSIS — F331 Major depressive disorder, recurrent, moderate: Secondary | ICD-10-CM | POA: Diagnosis not present

## 2024-04-24 DIAGNOSIS — Z634 Disappearance and death of family member: Secondary | ICD-10-CM | POA: Diagnosis not present

## 2024-04-29 ENCOUNTER — Other Ambulatory Visit: Payer: Self-pay | Admitting: Medical Genetics

## 2024-05-03 ENCOUNTER — Ambulatory Visit (INDEPENDENT_AMBULATORY_CARE_PROVIDER_SITE_OTHER): Payer: BC Managed Care – PPO | Admitting: Family Medicine

## 2024-05-03 ENCOUNTER — Encounter: Payer: Self-pay | Admitting: Family Medicine

## 2024-05-03 VITALS — BP 108/78 | HR 75 | Temp 98.2°F | Ht 66.0 in | Wt 254.0 lb

## 2024-05-03 DIAGNOSIS — Z1211 Encounter for screening for malignant neoplasm of colon: Secondary | ICD-10-CM

## 2024-05-03 DIAGNOSIS — H60502 Unspecified acute noninfective otitis externa, left ear: Secondary | ICD-10-CM

## 2024-05-03 DIAGNOSIS — E559 Vitamin D deficiency, unspecified: Secondary | ICD-10-CM

## 2024-05-03 DIAGNOSIS — Z0001 Encounter for general adult medical examination with abnormal findings: Secondary | ICD-10-CM | POA: Diagnosis not present

## 2024-05-03 DIAGNOSIS — Z1231 Encounter for screening mammogram for malignant neoplasm of breast: Secondary | ICD-10-CM

## 2024-05-03 DIAGNOSIS — Z0000 Encounter for general adult medical examination without abnormal findings: Secondary | ICD-10-CM

## 2024-05-03 DIAGNOSIS — F900 Attention-deficit hyperactivity disorder, predominantly inattentive type: Secondary | ICD-10-CM

## 2024-05-03 DIAGNOSIS — E782 Mixed hyperlipidemia: Secondary | ICD-10-CM

## 2024-05-03 DIAGNOSIS — Z122 Encounter for screening for malignant neoplasm of respiratory organs: Secondary | ICD-10-CM

## 2024-05-03 MED ORDER — HYDROCORTISONE-ACETIC ACID 1-2 % OT SOLN
3.0000 [drp] | Freq: Two times a day (BID) | OTIC | 0 refills | Status: AC
Start: 2024-05-03 — End: 2024-05-10

## 2024-05-03 MED ORDER — LISDEXAMFETAMINE DIMESYLATE 40 MG PO CAPS
40.0000 mg | ORAL_CAPSULE | ORAL | 0 refills | Status: AC
Start: 2024-05-03 — End: ?

## 2024-05-03 NOTE — Progress Notes (Signed)
 Subjective:  Patient ID: Jeanette Alvarado, female    DOB: [birth year]  Age: 53 y.o. MRN: [id number]  Chief Complaint  Patient presents with   Annual Exam   ADHD   Otalgia    Well Adult Physical: Patient here for a comprehensive physical exam.The patient reports left ear itching/pain and ADHD medication not working.  Do you take any herbs or supplements that were not prescribed by a doctor? no Are you taking calcium  supplements? no Are you taking aspirin daily? no  Encounter for general adult medical examination without abnormal findings  Physical ("At Risk" items are starred): Patient's last physical exam was 1 year ago .  Patient is not afflicted from Stress Incontinence and Urge Incontinence  Patient wears a seat belts Patient has smoke detectors and has carbon monoxide detectors. Patient practices appropriate gun safety. Patient wears sunscreen with extended sun exposure. Dental Care: Dentures Ophthalmology/Optometry: Annual visit.  Hearing loss: none Vision impairments: Glasses  Menarche: 12 Menstrual History: reg LMP: s/p menopause Pregnancy history: 6 Safe at home: yes Self breast exams: yes  Discussed the use of AI scribe software for clinical note transcription with the patient, who gave verbal consent to proceed.  History of Present Illness   Jeanette Alvarado is a 53 year old female who presents for a follow-up visit regarding her medication management and routine screenings.  She has been taking a vitamin D  supplement but has not noticed any significant improvement in her energy levels. She feels extremely tired, stating it takes about six hours to feel fully awake. She is currently taking Vyvanse  30 mg but feels it is no longer effective, as she struggles to focus on tasks such as reading. She requests an increase in her Vyvanse  dosage.  She wakes up three times a night to urinate, which disrupts her sleep. No chest pain or shortness of breath. She mentions having  some issues with her ears, noting that one ear itches intermittently and was previously red, for which she was on antibiotics a few months ago.  She notes that her ankle 'catches' after sitting for a while and feels like it doesn't want to support her when she stands. She wears brooks at work for comfort on concrete floors, as they provide the necessary support for her 13-hour shifts.  In terms of her routine screenings, she is scheduled for a CT lung cancer screening and has a referral for mammogram and GI for a colonoscopy. She had a hysterectomy, so she does not require a Pap smear. Her last blood work in November showed slightly elevated cholesterol.         05/03/2024    8:51 AM 01/26/2024   10:05 AM 12/25/2023   11:22 AM 11/23/2023   10:45 AM 10/25/2023    9:46 AM  Depression screen PHQ 2/9  Decreased Interest 1 2 2 1 2   Down, Depressed, Hopeless 1 1 2 1 3   PHQ - 2 Score 2 3 4 2 5   Altered sleeping 2 3 2 3 2   Tired, decreased energy 2 3 3 3 2   Change in appetite 0 1 1 1 1   Feeling bad or failure about yourself  1 2 2 1 3   Trouble concentrating 3 1 3 3 3   Moving slowly or fidgety/restless 2 1 2 2 2   Suicidal thoughts 0 0 0 0 2  PHQ-9 Score 12 14 17 15 20   Difficult doing work/chores Somewhat difficult Somewhat difficult Somewhat difficult Somewhat difficult Somewhat difficult  02/24/2021    9:34 AM 03/27/2023    8:38 AM 07/19/2023    8:45 AM 12/25/2023   11:22 AM 05/03/2024    8:51 AM  Fall Risk  Falls in the past year? 0 0 0 0 0  Was there an injury with Fall? 0 0 0 0 0  Fall Risk Category Calculator 0 0 0 0 0  Fall Risk Category (Retired) Low      (RETIRED) Patient Fall Risk Level Low fall risk      Patient at Risk for Falls Due to  No Fall Risks No Fall Risks No Fall Risks No Fall Risks  Fall risk Follow up  Falls evaluation completed Falls evaluation completed;Follow up appointment Falls evaluation completed Falls evaluation completed             Social Hx    Social History   Socioeconomic History   Marital status: Divorced    Spouse name: Not on file   Number of children: 2   Years of education: Not on file   Highest education level: Associate degree: occupational, Scientist, product/process development, or vocational program  Occupational History   Not on file  Tobacco Use   Smoking status: Former    Current packs/day: 0.00    Average packs/day: 1.5 packs/day for 33.0 years (49.5 ttl pk-yrs)    Types: Cigarettes    Start date: 12/10/1988    Quit date: 12/10/2021    Years since quitting: 2.4   Smokeless tobacco: Never  Vaping Use   Vaping status: Former   Quit date: 03/13/2023  Substance and Sexual Activity   Alcohol use: Not Currently   Drug use: Not Currently   Sexual activity: Not Currently  Other Topics Concern   Not on file  Social History Narrative   Right handed   Social Drivers of Health   Financial Resource Strain: Low Risk  (12/22/2023)   Overall Financial Resource Strain (CARDIA)    Difficulty of Paying Living Expenses: Not very hard  Recent Concern: Financial Resource Strain - Medium Risk (10/25/2023)   Overall Financial Resource Strain (CARDIA)    Difficulty of Paying Living Expenses: Somewhat hard  Food Insecurity: No Food Insecurity (12/22/2023)   Hunger Vital Sign    Worried About Running Out of Food in the Last Year: Never true    Ran Out of Food in the Last Year: Never true  Recent Concern: Food Insecurity - Food Insecurity Present (10/25/2023)   Hunger Vital Sign    Worried About Running Out of Food in the Last Year: Sometimes true    Ran Out of Food in the Last Year: Never true  Transportation Needs: No Transportation Needs (12/22/2023)   PRAPARE - Administrator, Civil Service (Medical): No    Lack of Transportation (Non-Medical): No  Physical Activity: Sufficiently Active (12/22/2023)   Exercise Vital Sign    Days of Exercise per Week: 3 days    Minutes of Exercise per Session: 60 min  Recent Concern: Physical  Activity - Insufficiently Active (10/25/2023)   Exercise Vital Sign    Days of Exercise per Week: 3 days    Minutes of Exercise per Session: 30 min  Stress: Stress Concern Present (12/22/2023)   [employer] of Occupational Health - Occupational Stress Questionnaire    Feeling of Stress : Very much  Social Connections: Moderately Isolated (12/22/2023)   Social Connection and Isolation Panel [NHANES]    Frequency of Communication with Friends and Family: Three times a week  Frequency of Social Gatherings with Friends and Family: Once a week    Attends Religious Services: 1 to 4 times per year    Active Member of [employer] or Organizations: No    Attends Engineer, structural: Not on file    Marital Status: Divorced   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   Found on CT scan 2022   Ascending aortic aneurysm (HCC) 04/20/2021   BMI 36.0-36.9,adult 07/09/2020   Chest pain 04/08/2021   Family history of premature CAD 04/20/2021   Fatigue 06/01/2021   GERD (gastroesophageal reflux disease) 04/08/2021   Major depressive disorder, recurrent, moderate (HCC)    Migraine without aura, not intractable, without status migrainosus    NSVT (nonsustained ventricular tachycardia) (HCC) 06/01/2021   Palpitations 04/20/2021   PAT (paroxysmal atrial tachycardia) (HCC) 06/01/2021   PVC's (premature ventricular contractions) 04/20/2021   Rash of foot 04/05/2022   Shortness of breath 06/01/2021   Shoulder pain, left 01/20/2021   Tobacco use 04/20/2021   Past Surgical History:  Procedure Laterality Date   ANTERIOR CERVICAL DECOMP/DISCECTOMY FUSION N/A 05/29/2023   Procedure: Anterior Cervical Decompression Fusion - Cervical four-Cervical five - Cervical five-Cervical six - Cervical six-Cervical seven;  Surgeon: Joaquin Mulberry, MD;  Location: [facility] OR;  Service: Neurosurgery;  Laterality: N/A;   DILATION AND CURETTAGE OF UTERUS      multiple miscarriages   exploratory laparotomy multiple times for endometriosis     KNEE ARTHROSCOPY Right    TONSILLECTOMY     TUBAL LIGATION     VAGINAL HYSTERECTOMY      Family History  Problem Relation Age of Onset   Breast cancer Mother    Hypothyroidism Mother    Hypertension Father    Hyperlipidemia Father    CVA Father     Review of Systems  Constitutional:  Negative for chills, fatigue and fever.  HENT:  Negative for congestion, ear pain, rhinorrhea and sore throat.   Respiratory:  Negative for cough and shortness of breath.   Cardiovascular:  Negative for chest pain.  Gastrointestinal:  Negative for abdominal pain, constipation, diarrhea, nausea and vomiting.  Genitourinary:  Negative for dysuria and urgency.  Musculoskeletal:  Negative for back pain and myalgias.  Neurological:  Negative for dizziness, weakness, light-headedness and headaches.  Psychiatric/Behavioral:  Negative for dysphoric mood. The patient is not nervous/anxious.      Objective:  BP 108/78   Pulse 75   Temp 98.2 F (36.8 C)   Ht 5\' 6"  (1.676 m)   Wt 254 lb (115.2 kg)   SpO2 95%   BMI 41.00 kg/m      05/03/2024    8:47 AM 01/26/2024   10:35 AM 01/26/2024   10:03 AM  BP/Weight  Systolic BP 108 100 148  Diastolic BP 78 78 92  Wt. (Lbs) 254  257  BMI 41 kg/m2  41.48 kg/m2    Physical Exam Vitals reviewed.  Constitutional:      Appearance: Normal appearance. She is well-groomed.  HENT:     Head: Normocephalic.     Right Ear: Tympanic membrane, ear canal and external ear normal.     Left Ear: Swelling present.     Ears:     Comments: Externa canal erythematous     Nose: Nose normal.     Mouth/Throat:     Mouth: Mucous membranes are moist.     Pharynx: Oropharynx is  clear. No oropharyngeal exudate or posterior oropharyngeal erythema.  Eyes:     Extraocular Movements: Extraocular movements intact.     Conjunctiva/sclera: Conjunctivae normal.     Pupils: Pupils are equal, round,  and reactive to light.  Cardiovascular:     Rate and Rhythm: Normal rate and regular rhythm.     Pulses: Normal pulses.     Heart sounds: Normal heart sounds. No murmur heard. Pulmonary:     Effort: Pulmonary effort is normal.     Breath sounds: Normal breath sounds. No wheezing.  Abdominal:     General: Bowel sounds are normal.     Palpations: Abdomen is soft. There is no mass.     Tenderness: There is no abdominal tenderness.  Musculoskeletal:        General: Normal range of motion.     Cervical back: Normal range of motion and neck supple.  Lymphadenopathy:     Cervical: No cervical adenopathy.  Skin:    General: Skin is warm and dry.     Findings: No lesion.  Neurological:     Mental Status: She is alert and oriented to person, place, and time. Mental status is at baseline.     Cranial Nerves: Cranial nerves 2-12 are intact.     Sensory: Sensation is intact.     Motor: Motor function is intact.     Coordination: Coordination is intact.     Gait: Gait is intact.     Deep Tendon Reflexes: Reflexes are normal and symmetric.  Psychiatric:        Attention and Perception: Attention normal.        Mood and Affect: Mood normal.        Speech: Speech normal.        Behavior: Behavior normal.        Thought Content: Thought content normal.        Cognition and Memory: Cognition normal.        Judgment: Judgment normal.     Lab Results  Component Value Date   WBC 6.6 10/25/2023   HGB 14.3 10/25/2023   HCT 42.5 10/25/2023   PLT 405 10/25/2023   GLUCOSE 90 05/03/2024   CHOL 166 05/03/2024   TRIG 116 05/03/2024   HDL 53 05/03/2024   LDLCALC 92 05/03/2024   ALT 17 05/03/2024   AST 16 05/03/2024   NA 140 05/03/2024   K 4.5 05/03/2024   CL 106 05/03/2024   CREATININE 0.84 05/03/2024   BUN 13 05/03/2024   CO2 18 (L) 05/03/2024   TSH 1.530 03/27/2023   INR 1.0 05/26/2023      Assessment & Plan:    Encounter for general adult medical examination with abnormal  findings Assessment & Plan: Due for CT lung cancer screening, GI referral for colonoscopy, and mammogram. No Pap smear needed due to prior hysterectomy. - Schedule CT lung cancer screening. - Refer to GI for colonoscopy. - Arrange mammogram, consider mammogram bus service.   Things to do to keep yourself healthy  - Exercise at least 30-45 minutes a day, 3-4 days a week.  - Eat a low-fat diet with lots of fruits and vegetables, up to 7-9 servings per day.  - Seatbelts can save your life. Wear them always.  - Smoke detectors on every level of your home, check batteries every year.  - Eye Doctor - have an eye exam every 1-2 years  - Safe sex - if you may be exposed to STDs, use a  condom.  - Alcohol -  If you drink, do it moderately, less than 2 drinks per day.  - Health Care Power of Attorney. Choose someone to speak for you if you are not able.  - Depression is common in our stressful world.If you're feeling down or losing interest in things you normally enjoy, please come in for a visit.  - Violence - If anyone is threatening or hurting you, please call immediately.    Mixed hyperlipidemia Assessment & Plan: Improved LDL improved from 132 to 92 No medicines.  Continue to work on eating a healthy diet and exercise.  Labs drawn today.    Orders: -     Comprehensive metabolic panel with GFR -     Lipid panel  Acute otitis externa of left ear, unspecified type Assessment & Plan: Intermittent itching and redness in one ear, currently red. Willing to try antibiotic drops. - Prescribe antibiotic ear drops.  Orders: -     Hydrocortisone-Acetic Acid; Place 3 drops into the left ear 2 (two) times daily for 7 days.  Dispense: 10 mL; Refill: 0  Attention deficit hyperactivity disorder (ADHD), predominantly inattentive type Assessment & Plan: Worsening focus and attention despite Vyvanse  30 mg. Requested dosage increase. - Increase Vyvanse  to 40 mg daily. - Monitor effectiveness and  side effects. - Consider increasing to 50 mg after one month if no improvement.  Orders: -     Lisdexamfetamine Dimesylate ; Take 1 capsule (40 mg total) by mouth every morning.  Dispense: 30 capsule; Refill: 0  Vitamin D  deficiency disease Assessment & Plan: Improved Managed with supplementation. No improvement in energy levels. Lab Results  Component Value Date   VD25OH 35.7 05/03/2024   VD25OH 23.9 (L) 12/25/2023   VD25OH 28.2 (L) 03/27/2023   - Continue vitamin D  supplementation. - labs drawn    Orders: -     VITAMIN D  25 Hydroxy (Vit-D Deficiency, Fractures)  Screening for lung cancer -     CT CHEST LUNG CANCER SCREENING LOW DOSE WO CONTRAST; Future  Screening for colon cancer -     Ambulatory referral to Gastroenterology  Encounter for screening mammogram for malignant neoplasm of breast -     3D Screening Mammogram, Left and Right; Future     Body mass index is 41 kg/m.   These are the goals we discussed:  Goals      Manage My Cholesterol     Timeframe:  Short-Term Goal Priority:  High Start Date:     05/03/24                        Expected End Date: 1 year                  Follow Up Date 09/03/2024    - change to whole grain breads, cereal, pasta - eat smaller or less servings of red meat - fill half the plate with nonstarchy vegetables - get blood test (fasting) done 1 week before next visit - increase the amount of fiber in food - read food labels for fat and fiber - switch to low-fat or skim milk    Why is this important?  Be healthier, live longer Changing cholesterol starts with eating heart-healthy foods.  Other steps may be to increase your activity and to quit if you smoke.    Notes:  Aim to do some physical activity for 150 minutes per week. This is typically divided into 5 days per  week, 30 minutes per day. The activity should be enough to get your heart rate up. Anything is better than nothing if you have time constraints.            This is a list of the screening recommended for you and due dates:  Health Maintenance  Topic Date Due   Screening for Lung Cancer  Never done   Colon Cancer Screening  08/06/2024   Flu Shot  07/12/2024   Mammogram  05/09/2025   DTaP/Tdap/Td vaccine (2 - Td or Tdap) 06/23/2030   Zoster (Shingles) Vaccine  Completed   HPV Vaccine  Aged Out   Meningitis B Vaccine  Aged Out   COVID-19 Vaccine  Discontinued   Hepatitis C Screening  Discontinued   HIV Screening  Discontinued     Meds ordered this encounter  Medications   lisdexamfetamine (VYVANSE ) 40 MG capsule    Sig: Take 1 capsule (40 mg total) by mouth every morning.    Dispense:  30 capsule    Refill:  0   acetic acid-hydrocortisone (VOSOL-HC) OTIC solution    Sig: Place 3 drops into the left ear 2 (two) times daily for 7 days.    Dispense:  10 mL    Refill:  0    Follow-up: Return in about 4 months (around 09/03/2024).  An After Visit Summary was printed and given to the patient.  Delford Felling, FNP [facility] [phone]

## 2024-05-04 ENCOUNTER — Ambulatory Visit: Payer: Self-pay | Admitting: Family Medicine

## 2024-05-04 DIAGNOSIS — Z122 Encounter for screening for malignant neoplasm of respiratory organs: Secondary | ICD-10-CM | POA: Insufficient documentation

## 2024-05-04 DIAGNOSIS — Z1231 Encounter for screening mammogram for malignant neoplasm of breast: Secondary | ICD-10-CM | POA: Insufficient documentation

## 2024-05-04 DIAGNOSIS — Z0001 Encounter for general adult medical examination with abnormal findings: Secondary | ICD-10-CM | POA: Insufficient documentation

## 2024-05-04 DIAGNOSIS — Z1211 Encounter for screening for malignant neoplasm of colon: Secondary | ICD-10-CM | POA: Insufficient documentation

## 2024-05-04 DIAGNOSIS — H60502 Unspecified acute noninfective otitis externa, left ear: Secondary | ICD-10-CM | POA: Insufficient documentation

## 2024-05-04 LAB — COMPREHENSIVE METABOLIC PANEL WITH GFR
ALT: 17 IU/L (ref 0–32)
AST: 16 IU/L (ref 0–40)
Albumin: 4.2 g/dL (ref 3.8–4.9)
Alkaline Phosphatase: 70 IU/L (ref 44–121)
BUN/Creatinine Ratio: 15 (ref 9–23)
BUN: 13 mg/dL (ref 6–24)
Bilirubin Total: 0.3 mg/dL (ref 0.0–1.2)
CO2: 18 mmol/L — ABNORMAL LOW (ref 20–29)
Calcium: 9.2 mg/dL (ref 8.7–10.2)
Chloride: 106 mmol/L (ref 96–106)
Creatinine, Ser: 0.84 mg/dL (ref 0.57–1.00)
Globulin, Total: 2.5 g/dL (ref 1.5–4.5)
Glucose: 90 mg/dL (ref 70–99)
Potassium: 4.5 mmol/L (ref 3.5–5.2)
Sodium: 140 mmol/L (ref 134–144)
Total Protein: 6.7 g/dL (ref 6.0–8.5)
eGFR: 84 mL/min/{1.73_m2} (ref >59)

## 2024-05-04 LAB — LIPID PANEL
Chol/HDL Ratio: 3.1 ratio (ref 0.0–4.4)
Cholesterol, Total: 166 mg/dL (ref 100–199)
HDL: 53 mg/dL (ref >39)
LDL Chol Calc (NIH): 92 mg/dL (ref 0–99)
Triglycerides: 116 mg/dL (ref 0–149)
VLDL Cholesterol Cal: 21 mg/dL (ref 5–40)

## 2024-05-04 LAB — VITAMIN D 25 HYDROXY (VIT D DEFICIENCY, FRACTURES): Vit D, 25-Hydroxy: 35.7 ng/mL (ref 30.0–100.0)

## 2024-05-04 NOTE — Assessment & Plan Note (Signed)
 Improved Managed with supplementation. No improvement in energy levels. Lab Results  Component Value Date   VD25OH 35.7 05/03/2024   VD25OH 23.9 (L) 12/25/2023   VD25OH 28.2 (L) 03/27/2023   - Continue vitamin D  supplementation. - labs drawn

## 2024-05-04 NOTE — Assessment & Plan Note (Signed)
 Improved LDL improved from 132 to 92 No medicines.  Continue to work on eating a healthy diet and exercise.  Labs drawn today.

## 2024-05-04 NOTE — Assessment & Plan Note (Signed)
 Worsening focus and attention despite Vyvanse  30 mg. Requested dosage increase. - Increase Vyvanse  to 40 mg daily. - Monitor effectiveness and side effects. - Consider increasing to 50 mg after one month if no improvement.

## 2024-05-04 NOTE — Assessment & Plan Note (Signed)
 Intermittent itching and redness in one ear, currently red. Willing to try antibiotic drops. - Prescribe antibiotic ear drops.

## 2024-05-04 NOTE — Assessment & Plan Note (Signed)
 Due for CT lung cancer screening, GI referral for colonoscopy, and mammogram. No Pap smear needed due to prior hysterectomy. - Schedule CT lung cancer screening. - Refer to GI for colonoscopy. - Arrange mammogram, consider mammogram bus service.   Things to do to keep yourself healthy  - Exercise at least 30-45 minutes a day, 3-4 days a week.  - Eat a low-fat diet with lots of fruits and vegetables, up to 7-9 servings per day.  - Seatbelts can save your life. Wear them always.  - Smoke detectors on every level of your home, check batteries every year.  - Eye Doctor - have an eye exam every 1-2 years  - Safe sex - if you may be exposed to STDs, use a condom.  - Alcohol -  If you drink, do it moderately, less than 2 drinks per day.  - Health Care Power of Attorney. Choose someone to speak for you if you are not able.  - Depression is common in our stressful world.If you're feeling down or losing interest in things you normally enjoy, please come in for a visit.  - Violence - If anyone is threatening or hurting you, please call immediately.

## 2024-05-07 ENCOUNTER — Ambulatory Visit
Admission: RE | Admit: 2024-05-07 | Discharge: 2024-05-07 | Disposition: A | Discharge status: Home / Self Care | Source: Ambulatory Visit | Attending: Family Medicine | Admitting: Family Medicine

## 2024-05-07 DIAGNOSIS — Z1231 Encounter for screening mammogram for malignant neoplasm of breast: Secondary | ICD-10-CM | POA: Diagnosis not present

## 2024-05-07 NOTE — Telephone Encounter (Signed)
 Copied from CRM [id number]. Topic: Clinical - Request for Lab/Test Order >> May 07, 2024  8:33 AM Adonis Hoot wrote: Reason for CRM: Patient called in to inquire about order for her mammogram being placed? Stated that she was suppose to get set up for mammogram bus today. She said if not she is okay with having order placed to hospital to have mammogram done.

## 2024-05-09 DIAGNOSIS — Z634 Disappearance and death of family member: Secondary | ICD-10-CM | POA: Diagnosis not present

## 2024-05-09 DIAGNOSIS — F331 Major depressive disorder, recurrent, moderate: Secondary | ICD-10-CM | POA: Diagnosis not present

## 2024-05-22 DIAGNOSIS — F331 Major depressive disorder, recurrent, moderate: Secondary | ICD-10-CM | POA: Diagnosis not present

## 2024-05-22 DIAGNOSIS — Z634 Disappearance and death of family member: Secondary | ICD-10-CM | POA: Diagnosis not present

## 2024-05-31 ENCOUNTER — Other Ambulatory Visit: Payer: Self-pay | Admitting: Family Medicine

## 2024-05-31 DIAGNOSIS — F331 Major depressive disorder, recurrent, moderate: Secondary | ICD-10-CM

## 2024-06-05 DIAGNOSIS — Z634 Disappearance and death of family member: Secondary | ICD-10-CM | POA: Diagnosis not present

## 2024-06-05 DIAGNOSIS — F331 Major depressive disorder, recurrent, moderate: Secondary | ICD-10-CM | POA: Diagnosis not present

## 2024-06-11 ENCOUNTER — Other Ambulatory Visit: Payer: Self-pay | Admitting: Family Medicine

## 2024-06-11 DIAGNOSIS — E559 Vitamin D deficiency, unspecified: Secondary | ICD-10-CM

## 2024-06-19 DIAGNOSIS — Z634 Disappearance and death of family member: Secondary | ICD-10-CM | POA: Diagnosis not present

## 2024-06-19 DIAGNOSIS — F331 Major depressive disorder, recurrent, moderate: Secondary | ICD-10-CM | POA: Diagnosis not present

## 2024-06-20 DIAGNOSIS — Z87891 Personal history of nicotine dependence: Secondary | ICD-10-CM | POA: Diagnosis not present

## 2024-06-20 DIAGNOSIS — Z122 Encounter for screening for malignant neoplasm of respiratory organs: Secondary | ICD-10-CM | POA: Diagnosis not present

## 2024-06-21 ENCOUNTER — Encounter: Payer: Self-pay | Admitting: Family Medicine

## 2024-06-21 DIAGNOSIS — Z87891 Personal history of nicotine dependence: Secondary | ICD-10-CM | POA: Diagnosis not present

## 2024-06-23 ENCOUNTER — Ambulatory Visit: Payer: Self-pay | Admitting: Family Medicine

## 2024-06-25 ENCOUNTER — Other Ambulatory Visit: Payer: Self-pay | Admitting: Medical Genetics

## 2024-06-25 DIAGNOSIS — Z006 Encounter for examination for normal comparison and control in clinical research program: Secondary | ICD-10-CM

## 2024-07-03 DIAGNOSIS — Z634 Disappearance and death of family member: Secondary | ICD-10-CM | POA: Diagnosis not present

## 2024-07-03 DIAGNOSIS — F331 Major depressive disorder, recurrent, moderate: Secondary | ICD-10-CM | POA: Diagnosis not present

## 2024-07-08 LAB — GENECONNECT MOLECULAR SCREEN: Genetic Analysis Overall Interpretation: NEGATIVE

## 2024-07-17 DIAGNOSIS — Z634 Disappearance and death of family member: Secondary | ICD-10-CM | POA: Diagnosis not present

## 2024-07-17 DIAGNOSIS — F331 Major depressive disorder, recurrent, moderate: Secondary | ICD-10-CM | POA: Diagnosis not present

## 2024-07-31 DIAGNOSIS — F331 Major depressive disorder, recurrent, moderate: Secondary | ICD-10-CM | POA: Diagnosis not present

## 2024-07-31 DIAGNOSIS — Z634 Disappearance and death of family member: Secondary | ICD-10-CM | POA: Diagnosis not present

## 2024-08-14 DIAGNOSIS — F331 Major depressive disorder, recurrent, moderate: Secondary | ICD-10-CM | POA: Diagnosis not present

## 2024-08-14 DIAGNOSIS — Z634 Disappearance and death of family member: Secondary | ICD-10-CM | POA: Diagnosis not present

## 2024-08-29 DIAGNOSIS — Z634 Disappearance and death of family member: Secondary | ICD-10-CM | POA: Diagnosis not present

## 2024-08-29 DIAGNOSIS — F331 Major depressive disorder, recurrent, moderate: Secondary | ICD-10-CM | POA: Diagnosis not present

## 2024-09-03 ENCOUNTER — Ambulatory Visit: Admitting: Family Medicine

## 2024-09-11 ENCOUNTER — Ambulatory Visit: Admitting: Family Medicine

## 2024-09-11 ENCOUNTER — Encounter: Payer: Self-pay | Admitting: Family Medicine

## 2024-09-11 VITALS — BP 104/68 | HR 78 | Temp 98.0°F | Resp 18 | Ht 66.0 in | Wt 259.2 lb

## 2024-09-11 DIAGNOSIS — F331 Major depressive disorder, recurrent, moderate: Secondary | ICD-10-CM

## 2024-09-11 DIAGNOSIS — Z8349 Family history of other endocrine, nutritional and metabolic diseases: Secondary | ICD-10-CM | POA: Diagnosis not present

## 2024-09-11 DIAGNOSIS — E782 Mixed hyperlipidemia: Secondary | ICD-10-CM

## 2024-09-11 DIAGNOSIS — E559 Vitamin D deficiency, unspecified: Secondary | ICD-10-CM

## 2024-09-11 DIAGNOSIS — N951 Menopausal and female climacteric states: Secondary | ICD-10-CM | POA: Insufficient documentation

## 2024-09-11 DIAGNOSIS — F900 Attention-deficit hyperactivity disorder, predominantly inattentive type: Secondary | ICD-10-CM | POA: Diagnosis not present

## 2024-09-11 DIAGNOSIS — M79644 Pain in right finger(s): Secondary | ICD-10-CM | POA: Insufficient documentation

## 2024-09-11 DIAGNOSIS — F3160 Bipolar disorder, current episode mixed, unspecified: Secondary | ICD-10-CM | POA: Diagnosis not present

## 2024-09-11 DIAGNOSIS — Z634 Disappearance and death of family member: Secondary | ICD-10-CM | POA: Diagnosis not present

## 2024-09-11 DIAGNOSIS — R7309 Other abnormal glucose: Secondary | ICD-10-CM | POA: Diagnosis not present

## 2024-09-11 DIAGNOSIS — M25561 Pain in right knee: Secondary | ICD-10-CM | POA: Insufficient documentation

## 2024-09-11 MED ORDER — PHENTERMINE HCL 37.5 MG PO TABS: 37.5000 mg | ORAL_TABLET | Freq: Every day | ORAL | 0 refills | Status: DC

## 2024-09-11 MED ORDER — VEOZAH 45 MG PO TABS: 45.0000 mg | ORAL_TABLET | Freq: Every day | ORAL | 0 refills | Status: DC

## 2024-09-11 NOTE — Assessment & Plan Note (Signed)
 Managed by specialist - Continue Lamictal, Vraylar  for depression - Continue Buspar  as needed for anxiety symptoms

## 2024-09-11 NOTE — Assessment & Plan Note (Signed)
 Right thumb swelling and pain, possible repetitive strain injury Right thumb swelling and pain, possibly due to repetitive strain  - Order x-ray of right thumb. - Consider brace and ergonomic mouse Orders:   DG Finger Thumb Right; Future

## 2024-09-11 NOTE — Assessment & Plan Note (Signed)
 BMI 41.84, Not at goal Obesity with weight gain Obesity with recent weight gain despite Adipex. No side effects reported with phentermine. - Prescribe Adipex. - Monitor weight and discuss lifestyle modifications. Orders:   phentermine (ADIPEX-P) 37.5 MG tablet; Take 1 tablet (37.5 mg total) by mouth daily before breakfast.   T4 AND TSH

## 2024-09-11 NOTE — Progress Notes (Signed)
 Subjective:  Patient ID: Jeanette Alvarado, female    DOB: July 10, 1971  Age: 53 y.o. MRN: [id number]  Chief Complaint  Patient presents with   Medical Management of Chronic Issues   Discussed the use of AI scribe software for clinical note transcription with the patient, who gave verbal consent to proceed.  History of Present Illness   Jeanette Alvarado is a 53 year old female who presents with right knee pain following a fall.  Right knee pain and instability - Significant right knee pain for the past seven days following a fall during vacation - Associated swelling of the right knee - Sensation that the knee might 'fall out backwards' when stepping - Pain managed with ibuprofen and Tylenol  - Ice and heat provide some relief - No imaging performed to date  Weight gain and fatigue - Concern about weight gain despite previous prescriptions of Adipex and Vyvanse  - Current psychiatric medications include Vyvanse , Vraylar , Buspar , and recently added Lamictal - Fatigue and thinning hair present - Family history of thyroid disease; mother had thyroidectomy  Menopausal vasomotor symptoms - Severe hot flashes and night sweats disrupting sleep - History of Effexor  use for symptoms, discontinued due to intolerable side effects  Right thumb pain and swelling - Swelling and pain in the right thumb for approximately two months - Symptoms worsen with repetitive movements such as typing or writing - Significant swelling at times, causing pain and limiting movement - Partial relief with ice and pressure  Headaches and migraine history - History of migraines, improved since ear piercing - Occasional headaches, manageable before escalation             05/03/2024    8:51 AM 01/26/2024   10:05 AM 12/25/2023   11:22 AM 11/23/2023   10:45 AM 10/25/2023    9:46 AM  Depression screen PHQ 2/9  Decreased Interest 1 2 2 1 2   Down, Depressed, Hopeless 1 1 2 1 3   PHQ - 2 Score 2 3 4 2 5   Altered  sleeping 2 3 2 3 2   Tired, decreased energy 2 3 3 3 2   Change in appetite 0 1 1 1 1   Feeling bad or failure about yourself  1 2 2 1 3   Trouble concentrating 3 1 3 3 3   Moving slowly or fidgety/restless 2 1 2 2 2   Suicidal thoughts 0 0 0 0 2  PHQ-9 Score 12 14 17 15 20   Difficult doing work/chores Somewhat difficult Somewhat difficult Somewhat difficult Somewhat difficult Somewhat difficult        05/03/2024    8:51 AM  Fall Risk   Falls in the past year? 0  Number falls in past yr: 0  Injury with Fall? 0  Risk for fall due to : No Fall Risks  Follow up Falls evaluation completed    Patient Care Team: Teressa Harrie HERO, FNP as PCP - General (Family Medicine) Tobb, Kardie, DO as PCP - Cardiology (Cardiology)   Review of Systems  Constitutional:  Negative for chills, diaphoresis, fatigue and fever.  HENT:  Negative for congestion, ear pain and sinus pain.   Eyes: Negative.   Respiratory:  Negative for cough and shortness of breath.   Cardiovascular:  Negative for chest pain.  Gastrointestinal:  Negative for abdominal pain, constipation, diarrhea, nausea and vomiting.  Endocrine: Negative.   Genitourinary:  Negative for dysuria, frequency and urgency.       Hot flashes  Musculoskeletal:  Positive for arthralgias (right knee)  and joint swelling (right knee and right thumb).  Allergic/Immunologic: Negative.   Neurological:  Positive for headaches. Negative for dizziness, weakness and light-headedness.  Hematological: Negative.   Psychiatric/Behavioral:  Negative for dysphoric mood. The patient is not nervous/anxious.     Current Outpatient Medications on File Prior to Visit  Medication Sig Dispense Refill   busPIRone  (BUSPAR ) 7.5 MG tablet TAKE 1 TABLET(7.5 MG) BY MOUTH TWICE DAILY 60 tablet 0   gabapentin  (NEURONTIN ) 300 MG capsule TAKE 1 CAPSULE BY MOUTH EVERY MORNING, AT NOON, AND AT BEDTIME 90 capsule 3   lamoTRIgine (LAMICTAL) 25 MG tablet Take 25 mg by mouth daily.      lisdexamfetamine (VYVANSE ) 40 MG capsule Take 1 capsule (40 mg total) by mouth every morning. 30 capsule 0   propranolol  (INDERAL ) 20 MG tablet Take 0.5 tablets (10 mg total) by mouth 2 (two) times daily. 60 tablet 2   SUMAtriptan  (IMITREX ) 100 MG tablet Take 1 tablet (100 mg total) by mouth every 2 (two) hours as needed for migraine. May repeat in 2 hours if headache persists or recurs. 10 tablet 5   Vitamin D , Ergocalciferol , (DRISDOL ) 1.25 MG (50000 UNIT) CAPS capsule TAKE 1 CAPSULE BY MOUTH EVERY 7 DAYS FOR 12 DOSES 12 capsule 0   VRAYLAR  1.5 MG capsule TAKE 1 CAPSULE BY MOUTH EVERY DAY 90 capsule 0   atorvastatin  (LIPITOR) 10 MG tablet TAKE 1 TABLET(10 MG) BY MOUTH DAILY (Patient not taking: Reported on 09/11/2024) 90 tablet 0   [DISCONTINUED] Gabapentin , Once-Daily, 300 MG TABS Take 1 tablet (300 mg total) by mouth in the morning, at noon, and at bedtime. 90 tablet 1   No current facility-administered medications on file prior to visit.   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   Found on CT scan 2022   Ascending aortic aneurysm 04/20/2021   Blood transfusion without reported diagnosis    BMI 36.0-36.9,adult 07/09/2020   Chest pain 04/08/2021   Family history of premature CAD 04/20/2021   Fatigue 06/01/2021   GERD (gastroesophageal reflux disease) 04/08/2021   Major depressive disorder, recurrent, moderate (HCC)    Migraine without aura, not intractable, without status migrainosus    Neuromuscular disorder (HCC)    NSVT (nonsustained ventricular tachycardia) (HCC) 06/01/2021   Palpitations 04/20/2021   PAT (paroxysmal atrial tachycardia) 06/01/2021   PVC's (premature ventricular contractions) 04/20/2021   Rash of foot 04/05/2022   Shortness of breath 06/01/2021   Shoulder pain, left 01/20/2021   Tobacco use 04/20/2021   Past Surgical History:  Procedure Laterality Date   ANTERIOR CERVICAL DECOMP/DISCECTOMY FUSION  N/A 05/29/2023   Procedure: Anterior Cervical Decompression Fusion - Cervical four-Cervical five - Cervical five-Cervical six - Cervical six-Cervical seven;  Surgeon: Joshua Alm Hamilton, MD;  Location: [facility] OR;  Service: Neurosurgery;  Laterality: N/A;   DILATION AND CURETTAGE OF UTERUS     multiple miscarriages   exploratory laparotomy multiple times for endometriosis     KNEE ARTHROSCOPY Right    TONSILLECTOMY     TUBAL LIGATION     VAGINAL HYSTERECTOMY      Family History  Problem Relation Age of Onset   Breast cancer Mother    Hypothyroidism Mother    Anxiety disorder Mother    Cancer Mother    Depression Mother    Early death Mother    Obesity Mother    Hypertension Father    Hyperlipidemia  Father    CVA Father    Diabetes Father    Stroke Father    Breast cancer Paternal Aunt    Breast cancer Maternal Grandmother    Cancer Maternal Grandmother    Breast cancer Paternal Grandmother    Cancer Paternal Grandmother    ADD / ADHD Son    Cancer Paternal Aunt    Miscarriages / [place] Daughter    Social History   Socioeconomic History   Marital status: Divorced    Spouse name: Not on file   Number of children: 2   Years of education: Not on file   Highest education level: Associate degree: occupational, Scientist, product/process development, or vocational program  Occupational History   Not on file  Tobacco Use   Smoking status: Former    Current packs/day: 0.00    Average packs/day: 1.5 packs/day for 33.0 years (49.5 ttl pk-yrs)    Types: Cigarettes    Start date: 12/10/1988    Quit date: 12/10/2021    Years since quitting: 2.7   Smokeless tobacco: Never  Vaping Use   Vaping status: Former   Quit date: 03/13/2023  Substance and Sexual Activity   Alcohol use: Not Currently   Drug use: Not Currently   Sexual activity: Not Currently  Other Topics Concern   Not on file  Social History Narrative   Right handed   Social Drivers of Health   Financial Resource Strain: Low Risk   (09/07/2024)   Overall Financial Resource Strain (CARDIA)    Difficulty of Paying Living Expenses: Not very hard  Food Insecurity: No Food Insecurity (09/07/2024)   Hunger Vital Sign    Worried About Running Out of Food in the Last Year: Never true    Ran Out of Food in the Last Year: Never true  Transportation Needs: No Transportation Needs (09/07/2024)   PRAPARE - Administrator, Civil Service (Medical): No    Lack of Transportation (Non-Medical): No  Physical Activity: Insufficiently Active (09/07/2024)   Exercise Vital Sign    Days of Exercise per Week: 3 days    Minutes of Exercise per Session: 30 min  Stress: Stress Concern Present (09/07/2024)   [employer] of Occupational Health - Occupational Stress Questionnaire    Feeling of Stress: Very much  Social Connections: Moderately Isolated (09/07/2024)   Social Connection and Isolation Panel    Frequency of Communication with Friends and Family: Three times a week    Frequency of Social Gatherings with Friends and Family: Once a week    Attends Religious Services: More than 4 times per year    Active Member of [employer] or Organizations: No    Attends Engineer, structural: Not on file    Marital Status: Divorced    Objective:  BP 104/68   Pulse 78   Temp 98 F (36.7 C) (Temporal)   Resp 18   Ht 5' 6 (1.676 m)   Wt 259 lb 3.2 oz (117.6 kg)   SpO2 99%   BMI 41.84 kg/m      09/11/2024    2:48 PM 05/03/2024    8:47 AM 01/26/2024   10:35 AM  BP/Weight  Systolic BP 104 108 100  Diastolic BP 68 78 78  Wt. (Lbs) 259.2 254   BMI 41.84 kg/m2 41 kg/m2     Physical Exam Vitals reviewed.  Constitutional:      General: She is not in acute distress.    Appearance: Normal appearance. She is obese. She  is not ill-appearing.  Eyes:     Conjunctiva/sclera: Conjunctivae normal.  Cardiovascular:     Rate and Rhythm: Normal rate and regular rhythm.     Heart sounds: Normal heart sounds. No murmur  heard. Pulmonary:     Effort: Pulmonary effort is normal.     Breath sounds: Normal breath sounds. No wheezing.  Abdominal:     Palpations: Abdomen is soft.  Musculoskeletal:     Right hand: Swelling (right thumb) present.     Right knee: Swelling present. Decreased range of motion. Tenderness present.     Left knee: Normal.  Neurological:     Mental Status: She is alert. Mental status is at baseline.  Psychiatric:        Mood and Affect: Mood normal.        Behavior: Behavior normal.      Lab Results  Component Value Date   WBC 6.6 10/25/2023   HGB 14.3 10/25/2023   HCT 42.5 10/25/2023   PLT 405 10/25/2023   GLUCOSE 90 05/03/2024   CHOL 166 05/03/2024   TRIG 116 05/03/2024   HDL 53 05/03/2024   LDLCALC 92 05/03/2024   ALT 17 05/03/2024   AST 16 05/03/2024   NA 140 05/03/2024   K 4.5 05/03/2024   CL 106 05/03/2024   CREATININE 0.84 05/03/2024   BUN 13 05/03/2024   CO2 18 (L) 05/03/2024   TSH 1.530 03/27/2023   INR 1.0 05/26/2023    Results for orders placed or performed in visit on 06/25/24  Gene[employer] Molecular Screen   Collection Time: 06/25/24 11:09 AM  Result Value Ref Range   Genetic Analysis Overall Interpretation Negative    Genetic Disease Assessed      This is a screening test and does not detect all pathogenic or likely pathogenic variant(s) in the tested genes; diagnostic testing is recommended for individuals with a personal or family history of heart disease or hereditary cancer. [employer]  Population Screen is a screening test that analyzes 11 genes related to hereditary breast and ovarian cancer (HBOC) syndrome, Lynch syndrome, and familial hypercholesterolemia. This test only reports clinically significant pathogenic and likely  pathogenic variants but does not report variants of uncertain significance (VUS). In addition, analysis of the PMS2 gene excludes exons 11-15, which overlap with a known pseudogene (PMS2CL).    Genetic Analysis Report       No pathogenic or likely pathogenic variants were detected in the genes analyzed by this test.Genetic test results should be interpreted in the context of an individual's personal medical and family history. Alteration to medical management is NOT  recommended based solely on this result. Clinical correlation is advised.Additional Considerations- This is a screening test; individuals may still carry pathogenic or likely pathogenic variant(s) in the tested genes that are not detected by this test.-  For individuals at risk for these or other related conditions based on factors including personal or family history, diagnostic testing is recommended.- The absence of pathogenic or likely pathogenic variant(s) in the analyzed genes, while reassuring,  does not eliminate the possibility of a hereditary condition; there are other variants and genes associated with heart disease and hereditary cancer that are not included in this test.    Genes Tested See Notes    Disclaimer See Notes    Sequencing Location See Notes    Interpretation Methods and Limitations See Notes   .  Assessment & Plan:   Assessment & Plan Mixed hyperlipidemia Well controlled on Lipitor  10 mg  Last lipids Lab Results  Component Value Date   CHOL 166 05/03/2024   HDL 53 05/03/2024   LDLCALC 92 05/03/2024   TRIG 116 05/03/2024   CHOLHDL 3.1 05/03/2024  - Continue to work on lifestyle changes Orders:   CMP14+EGFR   Lipid panel   CBC with Differential  Vitamin D  deficiency disease Vitamin D  deficiency with recent lapse in supplementation. - Order vitamin D  level.  Orders:   Vitamin D , 25-hydroxy  Morbid obesity (HCC) BMI 41.84, Not at goal Obesity with weight gain Obesity with recent weight gain despite Adipex. No side effects reported with phentermine. - Prescribe Adipex. - Monitor weight and discuss lifestyle modifications. Orders:   phentermine (ADIPEX-P) 37.5 MG tablet; Take 1 tablet (37.5 mg total) by  mouth daily before breakfast.   T4 AND TSH  Major depressive disorder, recurrent, moderate (HCC) Managed by specialist - Continue Lamictal, Vraylar  for depression - Continue Buspar  as needed for anxiety symptoms    Acute pain of right knee Acute Right knee pain after fall Right knee pain with swelling and instability, possible ligamentous injury or internal derangement. - Refer to orthopedist for evaluation. - Continue ibuprofen and acetaminophen  for pain. Orders:   AMB referral to orthopedics  Thumb pain, right Right thumb swelling and pain, possible repetitive strain injury Right thumb swelling and pain, possibly due to repetitive strain  - Order x-ray of right thumb. - Consider brace and ergonomic mouse Orders:   DG Finger Thumb Right; Future  Vasomotor symptoms due to menopause Basal motor symptoms of menopause (hot flashes, night sweats) Severe basal motor symptoms affecting quality of life. Previous Effexor  not tolerated. Veozah recommended. - Prescribe Veozah. - Provide savings card for [facility]. Orders:   Fezolinetant (VEOZAH) 45 MG TABS; Take 1 tablet (45 mg total) by mouth daily.  Family history of thyroid disease Fatigue, possible thyroid dysfunction Chronic fatigue with weight gain, thinning hair, and mood swings, suspect thyroid dysfunction. - Order thyroid function tests. Orders:   T4 AND TSH    Meds ordered this encounter  Medications   phentermine (ADIPEX-P) 37.5 MG tablet    Sig: Take 1 tablet (37.5 mg total) by mouth daily before breakfast.    Dispense:  30 tablet    Refill:  0   Fezolinetant (VEOZAH) 45 MG TABS    Sig: Take 1 tablet (45 mg total) by mouth daily.    Dispense:  30 tablet    Refill:  0    Orders Placed This Encounter  Procedures   DG Finger Thumb Right   CMP14+EGFR   Lipid panel   Vitamin D , 25-hydroxy   T4 AND TSH   CBC with Differential   AMB referral to orthopedics     Follow-up: Return in about 3 months (around  12/12/2024) for chronic, fasting, lab visit.  An After Visit Summary was printed and given to the patient.  Harrie Cedar, FNP [facility] [phone]

## 2024-09-11 NOTE — Assessment & Plan Note (Signed)
 Basal motor symptoms of menopause (hot flashes, night sweats) Severe basal motor symptoms affecting quality of life. Previous Effexor  not tolerated. Veozah recommended. - Prescribe Veozah. - Provide savings card for [facility]. Orders:   Fezolinetant (VEOZAH) 45 MG TABS; Take 1 tablet (45 mg total) by mouth daily.

## 2024-09-11 NOTE — Assessment & Plan Note (Signed)
 Vitamin D  deficiency with recent lapse in supplementation. - Order vitamin D  level.  Orders:   Vitamin D , 25-hydroxy

## 2024-09-11 NOTE — Patient Instructions (Signed)
  VISIT SUMMARY: During today's visit, we discussed several health concerns including your right knee pain following a fall, weight gain and fatigue, menopausal symptoms, right thumb pain, and headaches. We have developed a plan to address each of these issues.  YOUR PLAN: -RIGHT KNEE PAIN AFTER FALL: You have significant pain and swelling in your right knee, which may indicate a ligament injury or other internal damage. We will refer you to an orthopedist for further evaluation. Continue taking ibuprofen and acetaminophen  for pain relief.  -RIGHT THUMB SWELLING AND PAIN: Your right thumb pain and swelling may be due to repetitive strain or carpal tunnel syndrome. We will order an x-ray of your thumb and consider additional imaging if needed.  -OBESITY WITH WEIGHT GAIN: You have experienced weight gain despite previous medications. We will prescribe Adipex to help manage your weight and will monitor your progress. Lifestyle changes such as diet and exercise are also important.  -MENOPAUSAL VASOMOTOR SYMPTOMS: You are experiencing severe hot flashes and night sweats that affect your quality of life. We will prescribe Veozah to help manage these symptoms and provide you with a savings card for the medication.  -FATIGUE, POSSIBLE THYROID DYSFUNCTION: Your fatigue, weight gain, thinning hair, and mood swings may be due to thyroid dysfunction. We will order thyroid function tests to investigate this further.  -VITAMIN D  DEFICIENCY: You have a vitamin D  deficiency, and we will order a vitamin D  level test to assess your current status.  INSTRUCTIONS: Please follow up with the orthopedist for your knee evaluation. Continue taking ibuprofen and acetaminophen  as needed for pain. Schedule an x-ray for your right thumb. Begin taking Adipex as prescribed and monitor your weight. Start Veozah for menopausal symptoms and use the savings card provided. Get your thyroid function and vitamin D  levels tested as  ordered. Follow up with us  to discuss the results and any further treatment needed.                      Contains text generated by [employer]bridge.                                 Contains text generated by [employer]bridge.

## 2024-09-11 NOTE — Assessment & Plan Note (Signed)
 Well controlled on Lipitor 10 mg  Last lipids Lab Results  Component Value Date   CHOL 166 05/03/2024   HDL 53 05/03/2024   LDLCALC 92 05/03/2024   TRIG 116 05/03/2024   CHOLHDL 3.1 05/03/2024  - Continue to work on lifestyle changes Orders:   CMP14+EGFR   Lipid panel   CBC with Differential

## 2024-09-11 NOTE — Assessment & Plan Note (Signed)
 Acute Right knee pain after fall Right knee pain with swelling and instability, possible ligamentous injury or internal derangement. - Refer to orthopedist for evaluation. - Continue ibuprofen and acetaminophen  for pain. Orders:   AMB referral to orthopedics

## 2024-09-11 NOTE — Assessment & Plan Note (Signed)
 Fatigue, possible thyroid dysfunction Chronic fatigue with weight gain, thinning hair, and mood swings, suspect thyroid dysfunction. - Order thyroid function tests. Orders:   T4 AND TSH

## 2024-09-12 ENCOUNTER — Ambulatory Visit (INDEPENDENT_AMBULATORY_CARE_PROVIDER_SITE_OTHER)
Admission: RE | Admit: 2024-09-12 | Discharge: 2024-09-12 | Disposition: A | Discharge status: Home / Self Care | Source: Ambulatory Visit | Attending: Family Medicine | Admitting: Family Medicine

## 2024-09-12 DIAGNOSIS — M79644 Pain in right finger(s): Secondary | ICD-10-CM

## 2024-09-12 LAB — CMP14+EGFR
ALT: 26 IU/L (ref 0–32)
AST: 22 IU/L (ref 0–40)
Albumin: 4.2 g/dL (ref 3.8–4.9)
Alkaline Phosphatase: 67 IU/L (ref 49–135)
BUN/Creatinine Ratio: 16 (ref 9–23)
BUN: 12 mg/dL (ref 6–24)
Bilirubin Total: 0.2 mg/dL (ref 0.0–1.2)
CO2: 18 mmol/L — ABNORMAL LOW (ref 20–29)
Calcium: 9.6 mg/dL (ref 8.7–10.2)
Chloride: 106 mmol/L (ref 96–106)
Creatinine, Ser: 0.76 mg/dL (ref 0.57–1.00)
Globulin, Total: 2.8 g/dL (ref 1.5–4.5)
Glucose: 88 mg/dL (ref 70–99)
Potassium: 4.6 mmol/L (ref 3.5–5.2)
Sodium: 140 mmol/L (ref 134–144)
Total Protein: 7 g/dL (ref 6.0–8.5)
eGFR: 94 mL/min/1.73 (ref >59)

## 2024-09-12 LAB — CBC WITH DIFFERENTIAL/PLATELET
Basophils Absolute: 0 x10E3/uL (ref 0.0–0.2)
Basos: 1 %
EOS (ABSOLUTE): 0.1 x10E3/uL (ref 0.0–0.4)
Eos: 2 %
Hematocrit: 42.4 % (ref 34.0–46.6)
Hemoglobin: 14 g/dL (ref 11.1–15.9)
Immature Grans (Abs): 0 x10E3/uL (ref 0.0–0.1)
Immature Granulocytes: 0 %
Lymphocytes Absolute: 2 x10E3/uL (ref 0.7–3.1)
Lymphs: 33 %
MCH: 30.9 pg (ref 26.6–33.0)
MCHC: 33 g/dL (ref 31.5–35.7)
MCV: 94 fL (ref 79–97)
Monocytes Absolute: 0.4 x10E3/uL (ref 0.1–0.9)
Monocytes: 7 %
Neutrophils Absolute: 3.4 x10E3/uL (ref 1.4–7.0)
Neutrophils: 57 %
Platelets: 398 x10E3/uL (ref 150–450)
RBC: 4.53 x10E6/uL (ref 3.77–5.28)
RDW: 13.3 % (ref 11.7–15.4)
WBC: 6 x10E3/uL (ref 3.4–10.8)

## 2024-09-12 LAB — VITAMIN D 25 HYDROXY (VIT D DEFICIENCY, FRACTURES): Vit D, 25-Hydroxy: 20.2 ng/mL — ABNORMAL LOW (ref 30.0–100.0)

## 2024-09-12 LAB — T4 AND TSH
T4, Total: 5.8 ug/dL (ref 4.5–12.0)
TSH: 1.03 u[IU]/mL (ref 0.450–4.500)

## 2024-09-12 LAB — LIPID PANEL
Chol/HDL Ratio: 3.8 ratio (ref 0.0–4.4)
Cholesterol, Total: 211 mg/dL — ABNORMAL HIGH (ref 100–199)
HDL: 55 mg/dL (ref >39)
LDL Chol Calc (NIH): 132 mg/dL — ABNORMAL HIGH (ref 0–99)
Triglycerides: 137 mg/dL (ref 0–149)
VLDL Cholesterol Cal: 24 mg/dL (ref 5–40)

## 2024-09-13 ENCOUNTER — Ambulatory Visit: Payer: Self-pay | Admitting: Family Medicine

## 2024-09-17 DIAGNOSIS — M25561 Pain in right knee: Secondary | ICD-10-CM | POA: Diagnosis not present

## 2024-09-17 DIAGNOSIS — M25461 Effusion, right knee: Secondary | ICD-10-CM | POA: Diagnosis not present

## 2024-09-23 DIAGNOSIS — M25561 Pain in right knee: Secondary | ICD-10-CM | POA: Diagnosis not present

## 2024-09-24 ENCOUNTER — Telehealth: Payer: Self-pay

## 2024-09-24 DIAGNOSIS — M25461 Effusion, right knee: Secondary | ICD-10-CM | POA: Diagnosis not present

## 2024-09-24 NOTE — Telephone Encounter (Signed)
 PA for Veozah submitted 10/8 via covermymeds denied.

## 2024-09-25 NOTE — Telephone Encounter (Unsigned)
 Copied from CRM [id number]. Topic: Clinical - Medication Question >> Sep 25, 2024 12:28 PM Pinkey ORN wrote: Reason for CRM: Fezolinetant (VEOZAH) 45 MG TABS >> Sep 25, 2024 12:30 PM Pinkey ORN wrote: Patient called in wanting to know is there another medication she can be prescribed since the insurance denied her prior authorization for Fezolinetant (VEOZAH) 45 MG TABS. Please follow up with patient.

## 2024-09-26 DIAGNOSIS — F331 Major depressive disorder, recurrent, moderate: Secondary | ICD-10-CM | POA: Diagnosis not present

## 2024-09-26 DIAGNOSIS — Z634 Disappearance and death of family member: Secondary | ICD-10-CM | POA: Diagnosis not present

## 2024-09-30 DIAGNOSIS — M25561 Pain in right knee: Secondary | ICD-10-CM | POA: Diagnosis not present

## 2024-10-09 DIAGNOSIS — F331 Major depressive disorder, recurrent, moderate: Secondary | ICD-10-CM | POA: Diagnosis not present

## 2024-10-09 DIAGNOSIS — Z634 Disappearance and death of family member: Secondary | ICD-10-CM | POA: Diagnosis not present

## 2024-10-10 ENCOUNTER — Other Ambulatory Visit: Payer: Self-pay | Admitting: Family Medicine

## 2024-10-10 DIAGNOSIS — F900 Attention-deficit hyperactivity disorder, predominantly inattentive type: Secondary | ICD-10-CM | POA: Diagnosis not present

## 2024-10-10 DIAGNOSIS — F3160 Bipolar disorder, current episode mixed, unspecified: Secondary | ICD-10-CM | POA: Diagnosis not present

## 2024-10-12 DIAGNOSIS — R7309 Other abnormal glucose: Secondary | ICD-10-CM | POA: Diagnosis not present

## 2024-10-23 DIAGNOSIS — F331 Major depressive disorder, recurrent, moderate: Secondary | ICD-10-CM | POA: Diagnosis not present

## 2024-10-23 DIAGNOSIS — Z634 Disappearance and death of family member: Secondary | ICD-10-CM | POA: Diagnosis not present

## 2024-10-28 DIAGNOSIS — F900 Attention-deficit hyperactivity disorder, predominantly inattentive type: Secondary | ICD-10-CM | POA: Diagnosis not present

## 2024-10-28 DIAGNOSIS — F3160 Bipolar disorder, current episode mixed, unspecified: Secondary | ICD-10-CM | POA: Diagnosis not present

## 2024-10-28 DIAGNOSIS — G4709 Other insomnia: Secondary | ICD-10-CM | POA: Diagnosis not present

## 2024-10-31 ENCOUNTER — Ambulatory Visit: Payer: Self-pay

## 2024-10-31 NOTE — Telephone Encounter (Signed)
 FYI Only or Action Required?: FYI only for provider: appointment scheduled on 11/01/24.  Patient was last seen in primary care on 09/11/2024 by Teressa Harrie HERO, FNP.  Called Nurse Triage reporting Hot Flashes.  Symptoms began several weeks ago.  Interventions attempted: Nothing.  Symptoms are: gradually worsening.  Triage Disposition: See PCP Within 2 Weeks  Patient/caregiver understands and will follow disposition?: Yes           Copied from CRM [id number]. Topic: Clinical - Red Word Triage >> Oct 31, 2024  8:17 AM Emylou G wrote: [facility] that prompted transfer to Nurse Triage:  severe hot flashes every 20 minutes.. feels like she is stabbed by tiny needles through her skin Reason for Disposition  [1] Menopause symptoms (e.g., hot flashes, irregular periods, vaginal dryness) AND [2] interfere with normal activities like work or sleep) AND [3] no improvement after using Care Advice  Answer Assessment - Initial Assessment Questions 1. MAIN SYMPTOM: What is your main symptom? How bad is it?  (e.g., difficulty concentrating, hot flashes, fatigue, irregular periods, vaginal dryness)     Hot flash - feels like someone is stabbing me all over my body with tiny needles 2. ONSET: When did the sx begin? (e.g., hours, days or weeks ago)     Worsening x 2-3 weeks 3. MENOPAUSE: When was your last menstrual period?     *No Answer* 4. MEDICINES: Are you taking any hormone prescription or over-the-counter medicines? (e.g., estrogen; estrogen/progesterone , herbal supplements, medicines for mood, new medicines, Niacin)      phentermine (ADIPEX-P) - pt reports not covered by insurance  5. OTHER SYMPTOMS: Do you have any other symptoms? (e.g., insomnia, mood swings, palpitations)     Night sweats 6. CALLER'S COPING: How are you doing? (e.g., anxiety, depression, emotional state with menopause symptoms)     frustration 7. TREATMENTS AND RESPONSE: What have you done so far to  try to make this better?     PCP tried wellbutrin but was experiencing SE. 8. PREGNANCY: Is there any chance you are pregnant? When was your last menstrual period?     N/a  Protocols used: Menopause Symptoms and Questions-A-AH

## 2024-11-01 ENCOUNTER — Ambulatory Visit: Admitting: Family Medicine

## 2024-11-01 VITALS — BP 110/68 | HR 91 | Temp 98.3°F | Ht 66.0 in | Wt 253.0 lb

## 2024-11-01 DIAGNOSIS — N952 Postmenopausal atrophic vaginitis: Secondary | ICD-10-CM | POA: Diagnosis not present

## 2024-11-01 DIAGNOSIS — N951 Menopausal and female climacteric states: Secondary | ICD-10-CM | POA: Diagnosis not present

## 2024-11-01 MED ORDER — ESTRADIOL 10 MCG VA TABS: 1.0000 | ORAL_TABLET | VAGINAL | 1 refills | Status: DC

## 2024-11-01 MED ORDER — VEOZAH 45 MG PO TABS: 45.0000 mg | ORAL_TABLET | Freq: Every day | ORAL | Status: DC

## 2024-11-01 NOTE — Progress Notes (Unsigned)
 Acute Office Visit  Subjective:    Patient ID: Jeanette Alvarado, female    DOB: [birth year], 53 y.o.   MRN: [id number]  Chief Complaint  Patient presents with  . Hot Flashes   Discussed the use of AI scribe software for clinical note transcription with the patient, who gave verbal consent to proceed.  History of Present Illness   Jeanette Alvarado is a 53 year old female who presents with severe menopausal symptoms.  Vasomotor and sensory menopausal symptoms - Severe hot flashes and night sweats causing significant sleep disturbances - Prickly skin sensations described as 'blades cutting' the skin, especially when in contact with water (e.g., during showers) - Requires air conditioning at night, even in cold weather, and uses only a sheet, yet continues to wake up sweating - Symptoms significantly impact quality of life and cause discomfort  Genitourinary symptoms of menopause - Atrophic vaginitis symptoms including vaginal dryness and itchiness - Finds these symptoms distressing  Medication intolerance and management challenges - History of antidepressant use, which caused suicidal ideation - Currently taking Vraylar  for ten years; recent increase in copay from $10 to $100 per month prompting search for alternatives - Currently working with a publishing rights manager at [employer] to adjust medications  Surgical menopause and ovarian function - History of hysterectomy with surgical menopause - Remaining ovary was functional until recently, which previously mitigated menopausal symptoms - Recent loss of ovarian function associated with worsening symptoms  Family history of breast cancer - Family history of breast cancer in both maternal and paternal grandparents - Negative for associated genetic markers       Past Medical History:  Diagnosis Date  . ADHD   . Allergy   . Anxiety   . Aortic dilatation 04/07/2021   Found on CT scan 2022  . Ascending aortic aneurysm  04/20/2021  . Blood transfusion without reported diagnosis   . BMI 36.0-36.9,adult 07/09/2020  . Chest pain 04/08/2021  . Family history of premature CAD 04/20/2021  . Fatigue 06/01/2021  . GERD (gastroesophageal reflux disease) 04/08/2021  . Major depressive disorder, recurrent, moderate (HCC)   . Migraine without aura, not intractable, without status migrainosus   . Neuromuscular disorder (HCC)   . NSVT (nonsustained ventricular tachycardia) (HCC) 06/01/2021  . Palpitations 04/20/2021  . PAT (paroxysmal atrial tachycardia) 06/01/2021  . PVC's (premature ventricular contractions) 04/20/2021  . Rash of foot 04/05/2022  . Shortness of breath 06/01/2021  . Shoulder pain, left 01/20/2021  . Tobacco use 04/20/2021    Past Surgical History:  Procedure Laterality Date  . ANTERIOR CERVICAL DECOMP/DISCECTOMY FUSION N/A 05/29/2023   Procedure: Anterior Cervical Decompression Fusion - Cervical four-Cervical five - Cervical five-Cervical six - Cervical six-Cervical seven;  Surgeon: Joshua Alm Hamilton, MD;  Location: [facility] OR;  Service: Neurosurgery;  Laterality: N/A;  . DILATION AND CURETTAGE OF UTERUS     multiple miscarriages  . exploratory laparotomy multiple times for endometriosis    . KNEE ARTHROSCOPY Right   . TONSILLECTOMY    . TUBAL LIGATION    . VAGINAL HYSTERECTOMY      Family History  Problem Relation Age of Onset  . Breast cancer Mother   . Hypothyroidism Mother   . Anxiety disorder Mother   . Cancer Mother   . Depression Mother   . Early death Mother   . Obesity Mother   . Hypertension Father   . Hyperlipidemia Father   . CVA Father   . Diabetes Father   .  Stroke Father   . Breast cancer Paternal Aunt   . Breast cancer Maternal Grandmother   . Cancer Maternal Grandmother   . Breast cancer Paternal Grandmother   . Cancer Paternal Grandmother   . ADD / ADHD Son   . Cancer Paternal Aunt   . Miscarriages / Stillbirths Daughter     Social History   Socioeconomic  History  . Marital status: Divorced    Spouse name: Not on file  . Number of children: 2  . Years of education: Not on file  . Highest education level: Associate degree: occupational, scientist, product/process development, or vocational program  Occupational History  . Not on file  Tobacco Use  . Smoking status: Former    Current packs/day: 0.00    Average packs/day: 1.5 packs/day for 33.0 years (49.5 ttl pk-yrs)    Types: Cigarettes    Start date: 12/10/1988    Quit date: 12/10/2021    Years since quitting: 2.8  . Smokeless tobacco: Never  Vaping Use  . Vaping status: Former  . Quit date: 03/13/2023  Substance and Sexual Activity  . Alcohol use: Not Currently  . Drug use: Not Currently  . Sexual activity: Not Currently  Other Topics Concern  . Not on file  Social History Narrative   Right handed   Social Drivers of Health   Financial Resource Strain: Low Risk  (11/01/2024)   Overall Financial Resource Strain (CARDIA)   . Difficulty of Paying Living Expenses: Not very hard  Food Insecurity: No Food Insecurity (11/01/2024)   Hunger Vital Sign   . Worried About Programme Researcher, Broadcasting/film/video in the Last Year: Never true   . Ran Out of Food in the Last Year: Never true  Transportation Needs: No Transportation Needs (11/01/2024)   PRAPARE - Transportation   . Lack of Transportation (Medical): No   . Lack of Transportation (Non-Medical): No  Physical Activity: Sufficiently Active (11/01/2024)   Exercise Vital Sign   . Days of Exercise per Week: 4 days   . Minutes of Exercise per Session: 40 min  Recent Concern: Physical Activity - Insufficiently Active (09/07/2024)   Exercise Vital Sign   . Days of Exercise per Week: 3 days   . Minutes of Exercise per Session: 30 min  Stress: Stress Concern Present (11/01/2024)   [employer] of Occupational Health - Occupational Stress Questionnaire   . Feeling of Stress: Rather much  Social Connections: Moderately Isolated (11/01/2024)   Social Connection and Isolation  Panel   . Frequency of Communication with Friends and Family: More than three times a week   . Frequency of Social Gatherings with Friends and Family: Once a week   . Attends Religious Services: More than 4 times per year   . Active Member of Clubs or Organizations: No   . Attends Banker Meetings: Not on file   . Marital Status: Divorced  Catering Manager Violence: Not At Risk (05/03/2024)   Humiliation, Afraid, Rape, and Kick questionnaire   . Fear of Current or Ex-Partner: No   . Emotionally Abused: No   . Physically Abused: No   . Sexually Abused: No    Outpatient Medications Prior to Visit  Medication Sig Dispense Refill  . atorvastatin  (LIPITOR) 10 MG tablet TAKE 1 TABLET(10 MG) BY MOUTH DAILY (Patient not taking: Reported on 09/11/2024) 90 tablet 0  . busPIRone  (BUSPAR ) 7.5 MG tablet TAKE 1 TABLET(7.5 MG) BY MOUTH TWICE DAILY 60 tablet 0  . Fezolinetant  (VEOZAH ) 45 MG TABS  Take 1 tablet (45 mg total) by mouth daily. 30 tablet 0  . gabapentin  (NEURONTIN ) 300 MG capsule TAKE 1 CAPSULE BY MOUTH EVERY MORNING, AT NOON, AND AT BEDTIME 90 capsule 3  . lamoTRIgine (LAMICTAL) 25 MG tablet Take 25 mg by mouth daily.    SABRA lisdexamfetamine (VYVANSE ) 40 MG capsule Take 1 capsule (40 mg total) by mouth every morning. 30 capsule 0  . phentermine  (ADIPEX-P ) 37.5 MG tablet TAKE 1 TABLET(37.5 MG) BY MOUTH DAILY BEFORE BREAKFAST 30 tablet 0  . propranolol  (INDERAL ) 20 MG tablet Take 0.5 tablets (10 mg total) by mouth 2 (two) times daily. 60 tablet 2  . SUMAtriptan  (IMITREX ) 100 MG tablet Take 1 tablet (100 mg total) by mouth every 2 (two) hours as needed for migraine. May repeat in 2 hours if headache persists or recurs. 10 tablet 5  . Vitamin D , Ergocalciferol , (DRISDOL ) 1.25 MG (50000 UNIT) CAPS capsule TAKE 1 CAPSULE BY MOUTH EVERY 7 DAYS FOR 12 DOSES 12 capsule 0  . VRAYLAR  1.5 MG capsule TAKE 1 CAPSULE BY MOUTH EVERY DAY 90 capsule 0   No facility-administered medications prior to  visit.    Allergies  Allergen Reactions  . Penicillins Anaphylaxis and Other (See Comments)    Unknown  . Aspirin Nausea And Vomiting  . Oxycodone  Nausea Only  . Aimovig [Erenumab-Aooe]   . Trintellix [Vortioxetine] Itching  . Pyridium [Phenazopyridine] Rash    Review of Systems     Objective:        11/01/2024   10:37 AM 09/11/2024    2:48 PM 05/03/2024    8:47 AM  Vitals with BMI  Height 5' 6 5' 6 5' 6  Weight 253 lbs 259 lbs 3 oz 254 lbs  BMI 40.85 41.86 41.02  Systolic  104 108  Diastolic  68 78  Pulse  78 75    No data found.   Physical Exam  Health Maintenance Due  Topic Date Due  . Hepatitis B Vaccines 19-59 Average Risk (1 of 3 - 19+ 3-dose series) Never done  . Pneumococcal Vaccine: 50+ Years (1 of 1 - PCV) Never done  . Influenza Vaccine  Never done  . Colonoscopy  08/06/2024       Topic Date Due  . Hepatitis B Vaccines 19-59 Average Risk (1 of 3 - 19+ 3-dose series) Never done     Lab Results  Component Value Date   TSH 1.030 09/11/2024   Lab Results  Component Value Date   WBC 6.0 09/11/2024   HGB 14.0 09/11/2024   HCT 42.4 09/11/2024   MCV 94 09/11/2024   PLT 398 09/11/2024   Lab Results  Component Value Date   NA 140 09/11/2024   K 4.6 09/11/2024   CO2 18 (L) 09/11/2024   GLUCOSE 88 09/11/2024   BUN 12 09/11/2024   CREATININE 0.76 09/11/2024   BILITOT 0.2 09/11/2024   ALKPHOS 67 09/11/2024   AST 22 09/11/2024   ALT 26 09/11/2024   PROT 7.0 09/11/2024   ALBUMIN 4.2 09/11/2024   CALCIUM  9.6 09/11/2024   ANIONGAP 10 05/26/2023   EGFR 94 09/11/2024   Lab Results  Component Value Date   CHOL 211 (H) 09/11/2024   Lab Results  Component Value Date   HDL 55 09/11/2024   Lab Results  Component Value Date   LDLCALC 132 (H) 09/11/2024   Lab Results  Component Value Date   TRIG 137 09/11/2024   Lab Results  Component Value Date  CHOLHDL 3.8 09/11/2024   No results found for: HGBA1C      Results for orders  placed or performed in visit on 09/11/24  CMP14+EGFR   Collection Time: 09/11/24  3:40 PM  Result Value Ref Range   Glucose 88 70 - 99 mg/dL   BUN 12 6 - 24 mg/dL   Creatinine, Ser 9.23 0.57 - 1.00 mg/dL   eGFR 94 >40 fO/fpw/8.26   BUN/Creatinine Ratio 16 9 - 23   Sodium 140 134 - 144 mmol/L   Potassium 4.6 3.5 - 5.2 mmol/L   Chloride 106 96 - 106 mmol/L   CO2 18 (L) 20 - 29 mmol/L   Calcium  9.6 8.7 - 10.2 mg/dL   Total Protein 7.0 6.0 - 8.5 g/dL   Albumin 4.2 3.8 - 4.9 g/dL   Globulin, Total 2.8 1.5 - 4.5 g/dL   Bilirubin Total 0.2 0.0 - 1.2 mg/dL   Alkaline Phosphatase 67 49 - 135 IU/L   AST 22 0 - 40 IU/L   ALT 26 0 - 32 IU/L  Lipid panel   Collection Time: 09/11/24  3:40 PM  Result Value Ref Range   Cholesterol, Total 211 (H) 100 - 199 mg/dL   Triglycerides 862 0 - 149 mg/dL   HDL 55 >60 mg/dL   VLDL Cholesterol Cal 24 5 - 40 mg/dL   LDL Chol Calc (NIH) 867 (H) 0 - 99 mg/dL   Chol/HDL Ratio 3.8 0.0 - 4.4 ratio  Vitamin D , 25-hydroxy   Collection Time: 09/11/24  3:40 PM  Result Value Ref Range   Vit D, 25-Hydroxy 20.2 (L) 30.0 - 100.0 ng/mL  T4 AND TSH   Collection Time: 09/11/24  3:41 PM  Result Value Ref Range   TSH 1.030 0.450 - 4.500 uIU/mL   T4, Total 5.8 4.5 - 12.0 ug/dL  CBC with Differential   Collection Time: 09/11/24  3:41 PM  Result Value Ref Range   WBC 6.0 3.4 - 10.8 x10E3/uL   RBC 4.53 3.77 - 5.28 x10E6/uL   Hemoglobin 14.0 11.1 - 15.9 g/dL   Hematocrit 57.5 65.9 - 46.6 %   MCV 94 79 - 97 fL   MCH 30.9 26.6 - 33.0 pg   MCHC 33.0 31.5 - 35.7 g/dL   RDW 86.6 88.2 - 84.5 %   Platelets 398 150 - 450 x10E3/uL   Neutrophils 57 Not Estab. %   Lymphs 33 Not Estab. %   Monocytes 7 Not Estab. %   Eos 2 Not Estab. %   Basos 1 Not Estab. %   Neutrophils Absolute 3.4 1.4 - 7.0 x10E3/uL   Lymphocytes Absolute 2.0 0.7 - 3.1 x10E3/uL   Monocytes Absolute 0.4 0.1 - 0.9 x10E3/uL   EOS (ABSOLUTE) 0.1 0.0 - 0.4 x10E3/uL   Basophils Absolute 0.0 0.0 - 0.2  x10E3/uL   Immature Granulocytes 0 Not Estab. %   Immature Grans (Abs) 0.0 0.0 - 0.1 x10E3/uL     Assessment & Plan:   Assessment & Plan     Body mass index is 40.84 kg/m.SABRA  Assessment and Plan      No orders of the defined types were placed in this encounter.   No orders of the defined types were placed in this encounter.    Follow-up: No follow-ups on file.  An After Visit Summary was printed and given to the patient.  Harrie CHRISTELLA Cedar, FNP [facility] [phone]

## 2024-11-03 ENCOUNTER — Encounter: Payer: Self-pay | Admitting: Family Medicine

## 2024-11-03 NOTE — Assessment & Plan Note (Addendum)
 Vaginal dryness and other severe menopausal symptoms. Family history of breast cancer, BRCA negative.  - Prescribed low-dose estradiol  vaginal tablets to insert once weekly, with option to increase frequency. Orders:   Estradiol  10 MCG TABS vaginal tablet; Place 1 tablet (10 mcg total) vaginally once a week.

## 2024-11-03 NOTE — Assessment & Plan Note (Addendum)
 Severe menopausal symptoms exacerbated by hysterectomy and ovarian function cessation. Hot flashes with no symptom relief from insurance preferred medication like Effexor . Insurance issues with Veozah , but she is willing to try it. - Samples of Veozah  given for 2 weeks - Will address insurance coverage for Veozah .   Orders:   Fezolinetant  (VEOZAH ) 45 MG TABS; Take 1 tablet (45 mg total) by mouth daily.

## 2024-11-04 DIAGNOSIS — Z634 Disappearance and death of family member: Secondary | ICD-10-CM | POA: Diagnosis not present

## 2024-11-04 DIAGNOSIS — F331 Major depressive disorder, recurrent, moderate: Secondary | ICD-10-CM | POA: Diagnosis not present

## 2024-11-12 DIAGNOSIS — F3160 Bipolar disorder, current episode mixed, unspecified: Secondary | ICD-10-CM | POA: Diagnosis not present

## 2024-11-12 DIAGNOSIS — F900 Attention-deficit hyperactivity disorder, predominantly inattentive type: Secondary | ICD-10-CM | POA: Diagnosis not present

## 2024-11-12 DIAGNOSIS — G4709 Other insomnia: Secondary | ICD-10-CM | POA: Diagnosis not present

## 2024-11-13 ENCOUNTER — Other Ambulatory Visit: Payer: Self-pay | Admitting: Family Medicine

## 2024-11-25 DIAGNOSIS — F331 Major depressive disorder, recurrent, moderate: Secondary | ICD-10-CM | POA: Diagnosis not present

## 2024-11-25 DIAGNOSIS — Z634 Disappearance and death of family member: Secondary | ICD-10-CM | POA: Diagnosis not present

## 2024-12-13 ENCOUNTER — Ambulatory Visit: Admitting: Family Medicine

## 2024-12-29 ENCOUNTER — Other Ambulatory Visit: Payer: Self-pay | Admitting: Family Medicine

## 2024-12-29 DIAGNOSIS — N952 Postmenopausal atrophic vaginitis: Secondary | ICD-10-CM
